# Patient Record
Sex: Female | Born: 1935 | Race: White | Hispanic: No | Smoking: Never smoker
Health system: Southern US, Community
[De-identification: ages and names within clinical notes are randomized; demographics above are authoritative.]

## PROBLEM LIST (undated history)

## (undated) DIAGNOSIS — I10 Essential (primary) hypertension: Secondary | ICD-10-CM

## (undated) DIAGNOSIS — S82899A Other fracture of unspecified lower leg, initial encounter for closed fracture: Secondary | ICD-10-CM

## (undated) DIAGNOSIS — E079 Disorder of thyroid, unspecified: Secondary | ICD-10-CM

## (undated) DIAGNOSIS — E785 Hyperlipidemia, unspecified: Secondary | ICD-10-CM

## (undated) HISTORY — PX: TOTAL HIP ARTHROPLASTY: SHX124

## (undated) HISTORY — PX: ABDOMINAL HYSTERECTOMY: SHX81

---

## 2009-05-17 ENCOUNTER — Ambulatory Visit (HOSPITAL_COMMUNITY): Admission: RE | Admit: 2009-05-17 | Discharge: 2009-05-18 | Payer: Self-pay | Admitting: Orthopaedic Surgery

## 2010-05-30 LAB — CBC
HCT: 36.5 % (ref 36.0–46.0)
Hemoglobin: 12.6 g/dL (ref 12.0–15.0)
MCHC: 34.4 g/dL (ref 30.0–36.0)
MCV: 93.3 fL (ref 78.0–100.0)
Platelets: 184 10*3/uL (ref 150–400)

## 2010-05-30 LAB — COMPREHENSIVE METABOLIC PANEL
ALT: 16 U/L (ref 0–35)
AST: 21 U/L (ref 0–37)
Albumin: 3.7 g/dL (ref 3.5–5.2)
Calcium: 8.8 mg/dL (ref 8.4–10.5)
Creatinine, Ser: 0.78 mg/dL (ref 0.4–1.2)
Total Bilirubin: 0.5 mg/dL (ref 0.3–1.2)
Total Protein: 7.3 g/dL (ref 6.0–8.3)

## 2010-05-30 LAB — PROTIME-INR
INR: 0.95 (ref 0.00–1.49)
Prothrombin Time: 12.6 seconds (ref 11.6–15.2)

## 2010-05-30 LAB — URINALYSIS, ROUTINE W REFLEX MICROSCOPIC
Glucose, UA: NEGATIVE mg/dL
Hgb urine dipstick: NEGATIVE
Ketones, ur: NEGATIVE mg/dL
pH: 6 (ref 5.0–8.0)

## 2010-05-30 LAB — DIFFERENTIAL
Neutro Abs: 2.7 10*3/uL (ref 1.7–7.7)
Neutrophils Relative %: 60 % (ref 43–77)

## 2010-05-30 LAB — URINE MICROSCOPIC-ADD ON

## 2010-08-11 ENCOUNTER — Encounter (HOSPITAL_COMMUNITY)
Admission: RE | Admit: 2010-08-11 | Discharge: 2010-08-11 | Disposition: A | Discharge status: Home / Self Care | Payer: Medicare Other | Source: Ambulatory Visit | Attending: Orthopaedic Surgery | Admitting: Orthopaedic Surgery

## 2010-08-11 ENCOUNTER — Other Ambulatory Visit (HOSPITAL_COMMUNITY): Payer: Self-pay | Admitting: Orthopaedic Surgery

## 2010-08-11 ENCOUNTER — Ambulatory Visit (HOSPITAL_COMMUNITY)
Admission: RE | Admit: 2010-08-11 | Discharge: 2010-08-11 | Disposition: A | Discharge status: Home / Self Care | Payer: Medicare Other | Source: Ambulatory Visit | Attending: Orthopaedic Surgery | Admitting: Orthopaedic Surgery

## 2010-08-11 DIAGNOSIS — M25559 Pain in unspecified hip: Secondary | ICD-10-CM | POA: Insufficient documentation

## 2010-08-11 DIAGNOSIS — Z01812 Encounter for preprocedural laboratory examination: Secondary | ICD-10-CM | POA: Insufficient documentation

## 2010-08-11 DIAGNOSIS — M25551 Pain in right hip: Secondary | ICD-10-CM

## 2010-08-11 DIAGNOSIS — Z01810 Encounter for preprocedural cardiovascular examination: Secondary | ICD-10-CM | POA: Insufficient documentation

## 2010-08-11 DIAGNOSIS — I10 Essential (primary) hypertension: Secondary | ICD-10-CM | POA: Insufficient documentation

## 2010-08-11 DIAGNOSIS — J438 Other emphysema: Secondary | ICD-10-CM | POA: Insufficient documentation

## 2010-08-11 DIAGNOSIS — Z01818 Encounter for other preprocedural examination: Secondary | ICD-10-CM | POA: Insufficient documentation

## 2010-08-11 LAB — CBC
Hemoglobin: 13.8 g/dL (ref 12.0–15.0)
MCV: 91.2 fL (ref 78.0–100.0)
Platelets: 238 10*3/uL (ref 150–400)
RDW: 14 % (ref 11.5–15.5)

## 2010-08-11 LAB — ABO/RH: ABO/RH(D): A POS

## 2010-08-11 LAB — TYPE AND SCREEN: ABO/RH(D): A POS

## 2010-08-11 LAB — SURGICAL PCR SCREEN: Staphylococcus aureus: NEGATIVE

## 2010-08-11 LAB — BASIC METABOLIC PANEL
CO2: 30 mEq/L (ref 19–32)
Calcium: 10 mg/dL (ref 8.4–10.5)
Chloride: 101 mEq/L (ref 96–112)

## 2010-08-15 ENCOUNTER — Inpatient Hospital Stay (HOSPITAL_COMMUNITY)
Admission: RE | Admit: 2010-08-15 | Discharge: 2010-08-18 | DRG: 470 | Disposition: A | Discharge status: Skilled Nursing Facility | Payer: Medicare Other | Source: Ambulatory Visit | Attending: Orthopaedic Surgery | Admitting: Orthopaedic Surgery

## 2010-08-15 ENCOUNTER — Inpatient Hospital Stay (HOSPITAL_COMMUNITY): Payer: Medicare Other

## 2010-08-15 DIAGNOSIS — Z7901 Long term (current) use of anticoagulants: Secondary | ICD-10-CM

## 2010-08-15 DIAGNOSIS — M87059 Idiopathic aseptic necrosis of unspecified femur: Principal | ICD-10-CM | POA: Diagnosis present

## 2010-08-15 DIAGNOSIS — S40029A Contusion of unspecified upper arm, initial encounter: Secondary | ICD-10-CM | POA: Diagnosis not present

## 2010-08-15 DIAGNOSIS — E785 Hyperlipidemia, unspecified: Secondary | ICD-10-CM | POA: Diagnosis present

## 2010-08-15 DIAGNOSIS — I10 Essential (primary) hypertension: Secondary | ICD-10-CM | POA: Diagnosis present

## 2010-08-15 DIAGNOSIS — G43909 Migraine, unspecified, not intractable, without status migrainosus: Secondary | ICD-10-CM | POA: Diagnosis present

## 2010-08-15 DIAGNOSIS — Y9210 Unspecified residential institution as the place of occurrence of the external cause: Secondary | ICD-10-CM | POA: Diagnosis not present

## 2010-08-15 DIAGNOSIS — W010XXA Fall on same level from slipping, tripping and stumbling without subsequent striking against object, initial encounter: Secondary | ICD-10-CM | POA: Diagnosis not present

## 2010-08-15 DIAGNOSIS — D62 Acute posthemorrhagic anemia: Secondary | ICD-10-CM | POA: Diagnosis not present

## 2010-08-15 LAB — URINALYSIS, ROUTINE W REFLEX MICROSCOPIC
Bilirubin Urine: NEGATIVE
Glucose, UA: NEGATIVE mg/dL
Hgb urine dipstick: NEGATIVE
pH: 6 (ref 5.0–8.0)

## 2010-08-15 LAB — HEPATIC FUNCTION PANEL
ALT: 11 U/L (ref 0–35)
Bilirubin, Direct: 0.1 mg/dL (ref 0.0–0.3)
Total Bilirubin: 0.5 mg/dL (ref 0.3–1.2)
Total Protein: 6.8 g/dL (ref 6.0–8.3)

## 2010-08-15 LAB — PROTIME-INR
INR: 0.93 (ref 0.00–1.49)
Prothrombin Time: 12.7 seconds (ref 11.6–15.2)

## 2010-08-15 LAB — URINE MICROSCOPIC-ADD ON

## 2010-08-16 LAB — BASIC METABOLIC PANEL WITH GFR
BUN: 19 mg/dL (ref 6–23)
CO2: 28 meq/L (ref 19–32)
Calcium: 8.6 mg/dL (ref 8.4–10.5)
Chloride: 103 meq/L (ref 96–112)
Creatinine, Ser: 0.89 mg/dL (ref 0.4–1.2)
GFR calc Af Amer: >60 mL/min
GFR calc non Af Amer: >60 mL/min
Glucose, Bld: 134 mg/dL — ABNORMAL HIGH (ref 70–99)
Potassium: 4.5 meq/L (ref 3.5–5.1)
Sodium: 139 meq/L (ref 135–145)

## 2010-08-16 LAB — CBC: MCV: 91.5 fL (ref 78.0–100.0)

## 2010-08-16 LAB — PROTIME-INR
INR: 1.05 (ref 0.00–1.49)
Prothrombin Time: 13.9 s (ref 11.6–15.2)

## 2010-08-17 LAB — CBC
HCT: 23.1 % — ABNORMAL LOW (ref 36.0–46.0)
Hemoglobin: 8.2 g/dL — ABNORMAL LOW (ref 12.0–15.0)
RBC: 2.58 MIL/uL — ABNORMAL LOW (ref 3.87–5.11)
WBC: 6 10*3/uL (ref 4.0–10.5)

## 2010-08-17 LAB — BASIC METABOLIC PANEL
BUN: 16 mg/dL (ref 6–23)
Chloride: 107 mEq/L (ref 96–112)
Glucose, Bld: 118 mg/dL — ABNORMAL HIGH (ref 70–99)
Potassium: 4 mEq/L (ref 3.5–5.1)
Sodium: 138 mEq/L (ref 135–145)

## 2010-08-17 LAB — PROTIME-INR: INR: 1.46 (ref 0.00–1.49)

## 2010-08-18 LAB — PROTIME-INR: INR: 1.91 — ABNORMAL HIGH (ref 0.00–1.49)

## 2010-09-12 NOTE — Discharge Summary (Signed)
  NAMECHRISTENA, [name]                 ACCOUNT NO.:  [id number]  MEDICAL RECORD NO.:  [id number]  LOCATION:  5001                         FACILITY:  [facility]  PHYSICIAN:  Mark C. Ophelia Charter, M.D.    DATE OF BIRTH:  May 01, 1928  DATE OF ADMISSION:  08/15/2010 DATE OF DISCHARGE:  08/18/2010                        DISCHARGE SUMMARY - REFERRING   She is being transferred to [name] at [name]  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  After informed consent, she was admitted and right total hip arthroplasty was performed on August 15, 2010, with 52 Press-Fit DePuy 10-degree liner and cemented DePuy #3 stem.  Postoperatively, she took her prophylactic IV antibiotics x1 2 doses.  She received a dose preop and was on Coumadin postop.  DVT prophylaxis which was monitored by pharmacy.  INR was 1.46 on August 17, 2010 and was 1.91 on August 18, 2010 the day of her transfer.  Hemoglobin was 8.2 and stable.  Electrolytes were normal other than glucose of 118. She did not require any transfusions.  She did well postoperatively. Had 1 episode when her bag came unplugged.  She tried to get up to the bathroom, but hit the call button and slipped on some urine, fell, bruising inside of her left arm, had good elbow and shoulder range of motion, and no injury to her hip.  She made good progress with physical therapy.  Was seen by PT and OT.  Incision was dry.  Postop x-rays showed excellent position and alignment.  Follow up with Dr. Ophelia Charter has already been scheduled in 10-12 days from now in his office for followup.  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  Postoperative anemia secondary to  normal intraoperative blood loss.  The patient is on Coumadin and will be on Coumadin for 3-1/2 weeks.  When she is 4 weeks from the date of surgery, Coumadin can be stopped.  Prescriptions were for Robaxin and also Percocet for pain. Robaxin for spasm.  HOME MEDICATIONS:  One aspirin a day was stopped while she is on Coumadin.  When she comes off Coumadin, she can restart though 1 aspirin a day.  CONDITION ON TRANSFER:  Improved.  She is on room 5001.    Mark C. Ophelia Charter, M.D.    MCY/MEDQ  D:  08/18/2010  T:  08/18/2010  Job:  [id number]  cc:   [name]  Electronically Signed by Annell Greening M.D. on 09/12/2010 02:24:55 PM

## 2010-09-12 NOTE — Op Note (Signed)
[name], [name]                 ACCOUNT NO.:  [id number]  MEDICAL RECORD NO.:  [id number]  LOCATION:  5001                         FACILITY:  [facility]  PHYSICIAN:  Manson Luckadoo C. Ophelia Charter, M.D.    DATE OF BIRTH:  10-04-1928  DATE OF PROCEDURE:  08/15/2010 DATE OF DISCHARGE:                              OPERATIVE REPORT   PREOPERATIVE DIAGNOSES:  Right hip avascular necrosis, previous femoral neck fracture pinning.  POSTOPERATIVE DIAGNOSES:  Right hip avascular necrosis, previous femoral neck fracture pinning.  PROCEDURE:  Right total hip arthroplasty.  SURGEON:  Annell Greening, MD  ASSISTANT:  Wende Neighbors, PA-C  ANESTHESIA:  GOT plus Marcaine local.  PROCEDURE IN DETAIL:  After induction of general anesthesia, orotracheal intubation, the patient was placed in lateral position, Audel Coakley VII frame, standard prepping and draping.  DuraPrep was used.  Preoperative antibiotics were given after draping, sterile skin marker for posterior approach, Betadine, sterile drape x2 sealing the skin.  Time-out procedure was completed.  Posterior approach was made.  Gluteus maximus was split in line with the fibers.  There was some periosteal bone formation with some deformity of the femur posteriorly in the intertrochanteric region and low subcapital region from the previous fracture, but the femoral neck was completely healed.  Posterior capsule was opened.  Piriformis was cut for later closure.  Hip was dislocated. Neck cut one fingerbreadth above the lesser trochanter.  As stab wound was addressed first, sequential reaming up to a 51 for 52 Press-Fit cup. The cup was inserted after touch up reaming for it would fit appropriately, and the 51 reamer was not really sharp, so we backed up to the 50 reamer.  This allowed some slight medialization to get down the base of the fovea and then removed the 51 and then the 52 cup gave excellent fit, was unable to be dislodged, was put at 45 degrees  of abduction and 30 degrees of cup flexion.  The blocker was placed and the filling of the central hole with the screw until it was tight.  10-degree liner was inserted with the buildup down posterolateral.  Next, the femur was repaired with canal starter reamer, lateral wiser reaming then broaching up to a #3.  Distal cement restrictor was placed, some was vacuum mixed.  A 11.5 centralizer was placed on the tip of the stem of the femoral DePuy prosthesis once it was opened.  Trial that showed a +1.5 neck gave excellent restoration of leg length and the hip could be flexed to 90 degrees, internal rotation 80-85 degrees for the hip would began to sublux.  Cement was vacuum mixed, inserted with glue gun, pressurized, and prosthesis held until the cement was hard.  Excess cement was removed.  Trunnion was cleaned and hip reduced.  Identical findings of excellent stability. Piriformis was repaired followed by gluteus maximus with #1-0 Ethibond and 0 Vicryl placed in gluteus maximus fascia, 2-0 Vicryl in subcutaneous tissue, 3-0 Vicryl subcuticular skin closure.  Tincture of Benzoin, Steri-Strips, postop dressing, and a knee immobilizer. Instrument count was correct, and time-out procedure was completed.     Darrah Dredge C. Ophelia Charter, M.D.     MCY/MEDQ  D:  08/15/2010  T:  08/16/2010  Job:  [id number]  Electronically Signed by Annell Greening M.D. on 09/12/2010 02:25:00 PM

## 2010-11-21 ENCOUNTER — Ambulatory Visit (HOSPITAL_COMMUNITY)
Admission: RE | Admit: 2010-11-21 | Discharge: 2010-11-21 | Disposition: A | Discharge status: Home / Self Care | Payer: Medicare Other | Source: Ambulatory Visit | Attending: Orthopaedic Surgery | Admitting: Orthopaedic Surgery

## 2010-11-21 DIAGNOSIS — M79609 Pain in unspecified limb: Secondary | ICD-10-CM | POA: Insufficient documentation

## 2010-11-21 DIAGNOSIS — I82819 Embolism and thrombosis of superficial veins of unspecified lower extremities: Secondary | ICD-10-CM | POA: Insufficient documentation

## 2010-11-21 DIAGNOSIS — M7989 Other specified soft tissue disorders: Secondary | ICD-10-CM | POA: Insufficient documentation

## 2012-07-14 ENCOUNTER — Emergency Department (HOSPITAL_BASED_OUTPATIENT_CLINIC_OR_DEPARTMENT_OTHER)
Admission: EM | Admit: 2012-07-14 | Discharge: 2012-07-14 | Disposition: A | Discharge status: Home / Self Care | Payer: Medicare Other | Attending: Emergency Medicine | Admitting: Emergency Medicine

## 2012-07-14 ENCOUNTER — Emergency Department (HOSPITAL_BASED_OUTPATIENT_CLINIC_OR_DEPARTMENT_OTHER): Payer: Medicare Other

## 2012-07-14 ENCOUNTER — Encounter (HOSPITAL_BASED_OUTPATIENT_CLINIC_OR_DEPARTMENT_OTHER): Payer: Self-pay

## 2012-07-14 DIAGNOSIS — W100XXA Fall (on)(from) escalator, initial encounter: Secondary | ICD-10-CM | POA: Insufficient documentation

## 2012-07-14 DIAGNOSIS — IMO0002 Reserved for concepts with insufficient information to code with codable children: Secondary | ICD-10-CM | POA: Insufficient documentation

## 2012-07-14 DIAGNOSIS — I10 Essential (primary) hypertension: Secondary | ICD-10-CM | POA: Insufficient documentation

## 2012-07-14 DIAGNOSIS — Y929 Unspecified place or not applicable: Secondary | ICD-10-CM | POA: Insufficient documentation

## 2012-07-14 DIAGNOSIS — E079 Disorder of thyroid, unspecified: Secondary | ICD-10-CM | POA: Insufficient documentation

## 2012-07-14 DIAGNOSIS — Z880 Allergy status to penicillin: Secondary | ICD-10-CM | POA: Insufficient documentation

## 2012-07-14 DIAGNOSIS — Y9389 Activity, other specified: Secondary | ICD-10-CM | POA: Insufficient documentation

## 2012-07-14 DIAGNOSIS — E785 Hyperlipidemia, unspecified: Secondary | ICD-10-CM | POA: Insufficient documentation

## 2012-07-14 DIAGNOSIS — Z7982 Long term (current) use of aspirin: Secondary | ICD-10-CM | POA: Insufficient documentation

## 2012-07-14 DIAGNOSIS — S46912A Strain of unspecified muscle, fascia and tendon at shoulder and upper arm level, left arm, initial encounter: Secondary | ICD-10-CM

## 2012-07-14 DIAGNOSIS — Z79899 Other long term (current) drug therapy: Secondary | ICD-10-CM | POA: Insufficient documentation

## 2012-07-14 HISTORY — DX: Essential (primary) hypertension: I10

## 2012-07-14 HISTORY — DX: Disorder of thyroid, unspecified: E07.9

## 2012-07-14 HISTORY — DX: Hyperlipidemia, unspecified: E78.5

## 2012-07-14 NOTE — ED Notes (Signed)
Pt states that she injured her shoulder in a fall on an escalator on Thursday.  Pt states that she has severe pain in the L shoulder, ROM limited due to pain.  PMS intact.  Also c/o L knee pain.

## 2012-07-14 NOTE — ED Provider Notes (Signed)
Medical screening examination/treatment/procedure(s) were performed by non-physician practitioner and as supervising physician I was immediately available for consultation/collaboration.  Ethelda Chick, MD 07/14/12 [phone]

## 2012-07-14 NOTE — ED Provider Notes (Signed)
History     CSN: [id number]  Arrival date & time 07/14/12  1003   First MD Initiated Contact with Patient 07/14/12 1134      Chief Complaint  Patient presents with  . Shoulder Injury    (Consider location/radiation/quality/duration/timing/severity/associated sxs/prior treatment) Patient is a 79 y.o. female presenting with shoulder injury. The history is provided by the patient. No language interpreter was used.  Shoulder Injury This is a new problem. The current episode started in the past 7 days. The problem occurs constantly. The problem has been unchanged. Associated symptoms include joint swelling. Nothing aggravates the symptoms. She has tried nothing for the symptoms. The treatment provided moderate relief.   Pt fell on an escalator 4 days ago. Past Medical History  Diagnosis Date  . Hypertension   . Hyperlipidemia   . Thyroid disease     Past Surgical History  Procedure Laterality Date  . Total hip arthroplasty Right   . Abdominal hysterectomy      History reviewed. No pertinent family history.  History  Substance Use Topics  . Smoking status: Never Smoker   . Smokeless tobacco: Never Used  . Alcohol Use: No    OB History   Grav Para Term Preterm Abortions TAB SAB Ect Mult Living                  Review of Systems  Musculoskeletal: Positive for joint swelling.  All other systems reviewed and are negative.    Allergies  Codeine; Levaquin; Penicillins; and Sulfa antibiotics  Home Medications   Current Outpatient Rx  Name  Route  Sig  Dispense  Refill  . aspirin 81 MG tablet   Oral   Take 81 mg by mouth daily.         . Cholecalciferol (VITAMIN D-3) 1000 UNITS CAPS   Oral   Take 1 capsule by mouth daily.         . enalapril (VASOTEC) 10 MG tablet   Oral   Take 10 mg by mouth daily.         Marland Kitchen levothyroxine (SYNTHROID, LEVOTHROID) 75 MCG tablet   Oral   Take 75 mcg by mouth daily before breakfast.         . lovastatin (MEVACOR) 20  MG tablet   Oral   Take 20 mg by mouth at bedtime.           BP 155/92  Pulse 79  Temp(Src) 98.7 F (37.1 C) (Oral)  Resp 20  Ht 5\' 6"  (1.676 m)  Wt 145 lb (65.772 kg)  BMI 23.41 kg/m2  SpO2 100%  Physical Exam  Nursing note and vitals reviewed. Constitutional: She appears well-developed and well-nourished.  HENT:  Head: Normocephalic and atraumatic.  Eyes: Pupils are equal, round, and reactive to light.  Neck: Normal range of motion.  Cardiovascular: Normal rate.   Pulmonary/Chest: Effort normal.  Musculoskeletal: She exhibits tenderness.  Tender left shoulder,  Decrease abduction,   nv and ns intact  Neurological: She is alert.  Skin: Skin is warm.  Psychiatric: She has a normal mood and affect.    ED Course  Procedures (including critical care time)  Labs Reviewed - No data to display Dg Shoulder Left  07/14/2012  *RADIOLOGY REPORT*  Clinical Data: Shoulder injury.  Posterior pain.  LEFT SHOULDER - 2+ VIEW  Comparison: None.  Findings: The left shoulder is located.  No acute bone or soft tissue abnormalities are present.  The visualized hemithorax is clear.  Mild degenerative changes are noted at the glenohumeral and AC joints.  IMPRESSION:  1.  Mild degenerative change. 2.  No acute abnormality.   Original Report Authenticated By: Marin Roberts, M.D.      1. Shoulder strain, left, initial encounter       MDM  Pt advised to follow up with her Md for recheck in 1 week.  Gentle range of motion,          Elson Areas, [place] 07/14/12 1227

## 2012-08-04 IMAGING — CR DG CHEST 2V
2 series · 2 of 2 positions shown · non-contrast
Comparison: 05/17/2009

CLINICAL DATA: Preoperative assessment for right total hip
arthroplasty, history hypertension

CHEST - 2 VIEW

[view not recorded (1 of 2)]
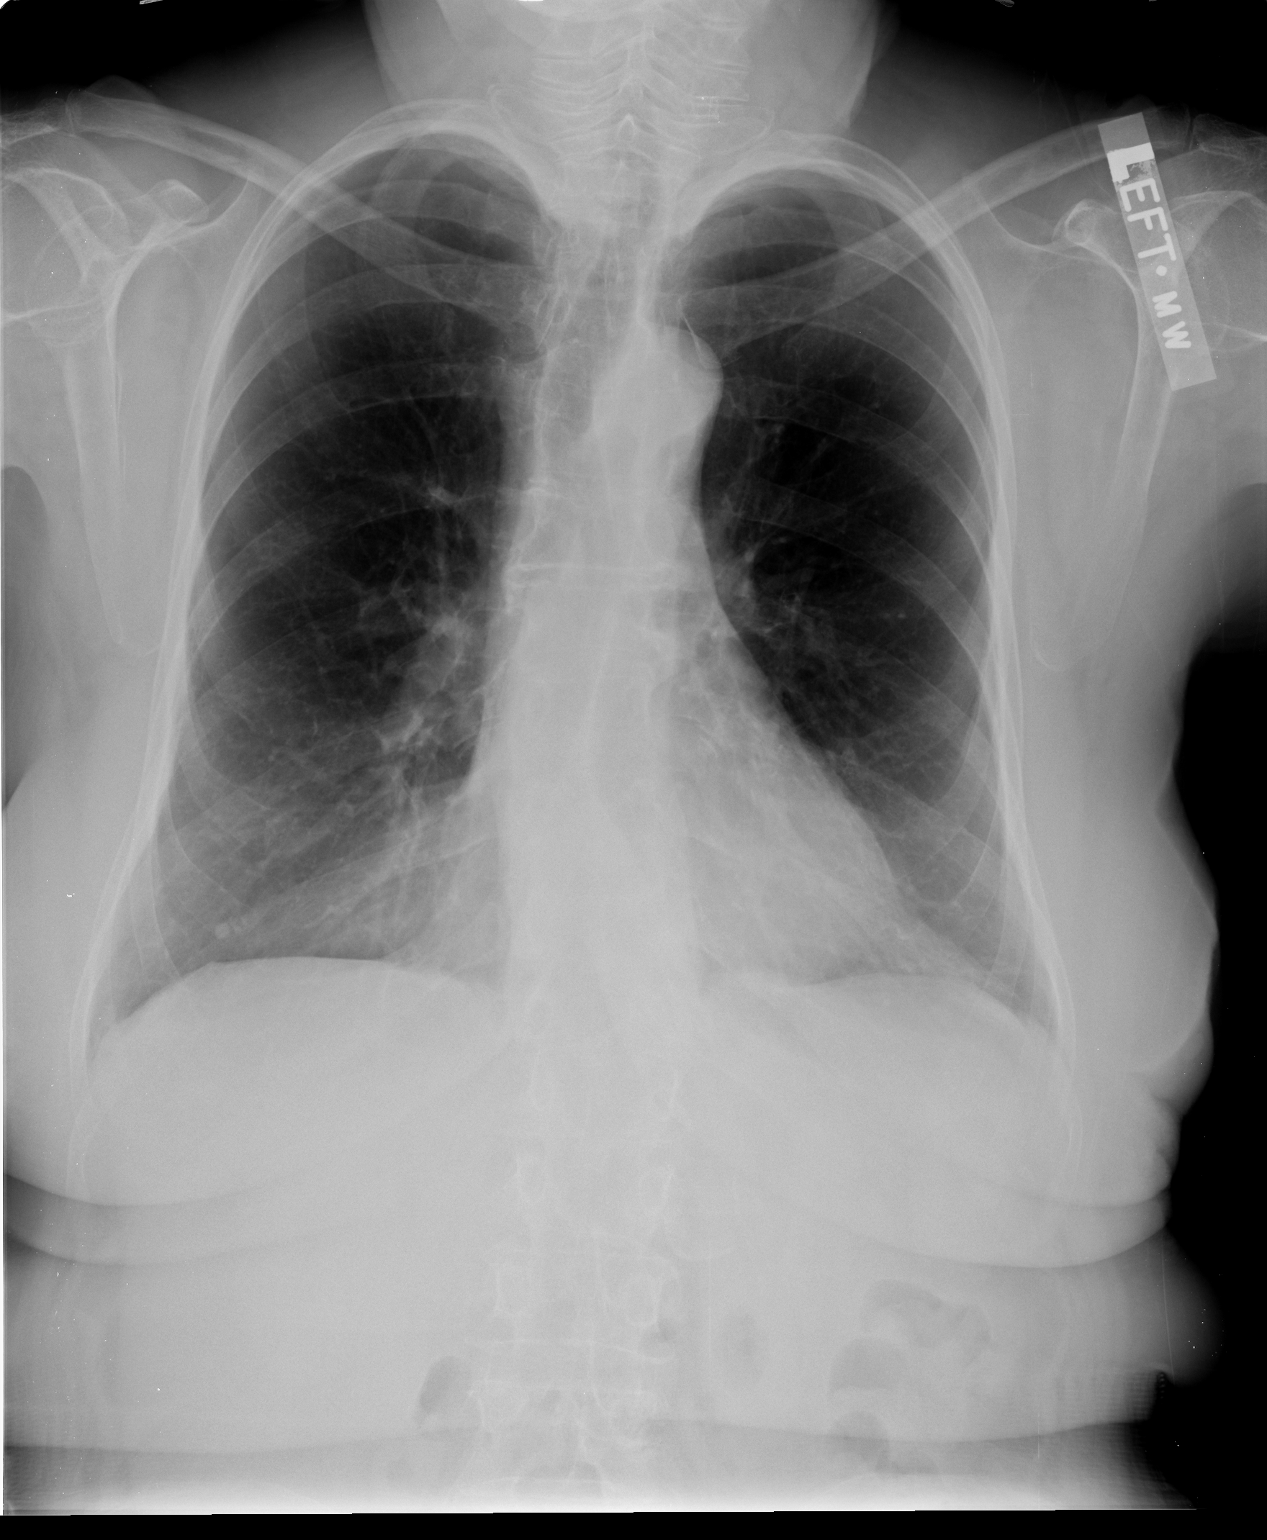

[view not recorded (2 of 2)]
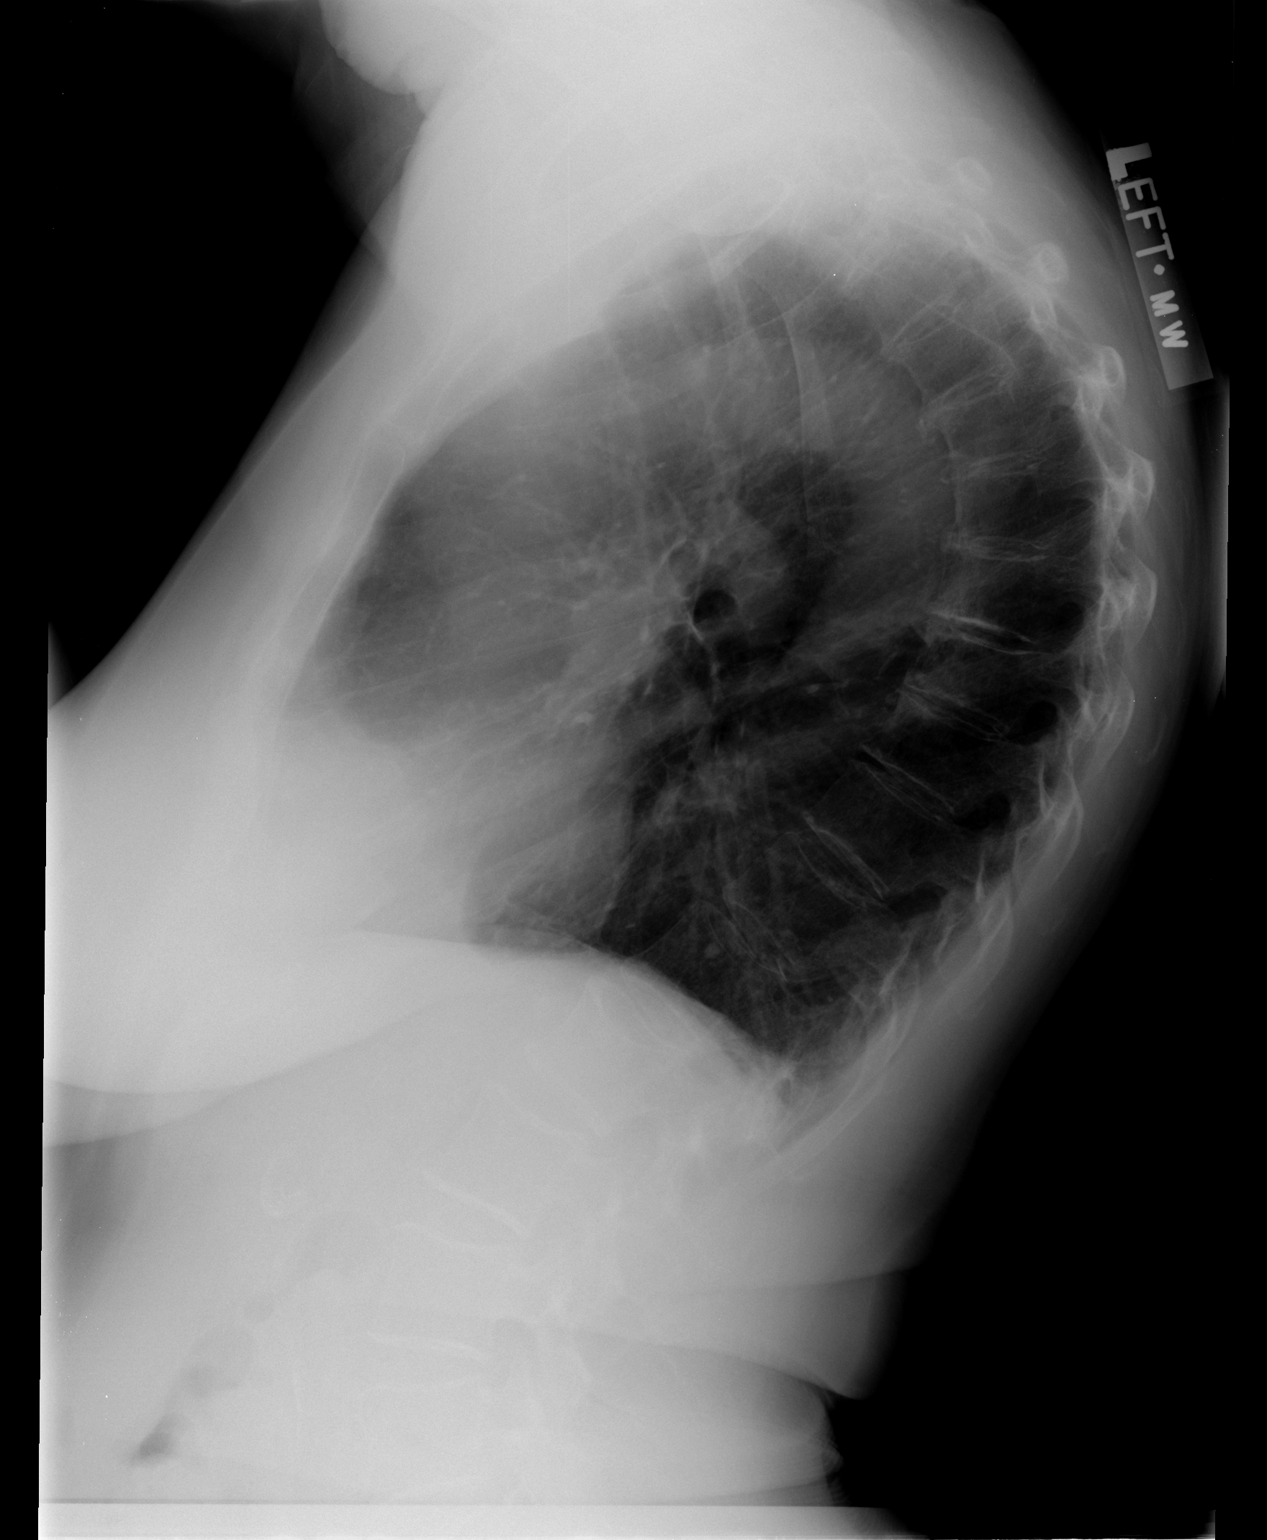

[2 of 2 positions shown; findings below may reference images not displayed]

FINDINGS: Normal heart size, mediastinal contours, and pulmonary vascularity.
Emphysematous changes without infiltrate or effusion.
Minimal subsegmental atelectasis versus scarring at lung bases.
Bones appear demineralized.
No pneumothorax.
IMPRESSION: Emphysematous changes with minimal bibasilar atelectasis versus
scarring.
No acute abnormalities otherwise seen.

## 2012-08-08 IMAGING — CR DG HIP 1V PORT*R*
1 series · 1 of 1 positions shown · non-contrast
Comparison: AP view of the pelvis 08/15/2010

CLINICAL DATA: Right total hip arthroplasty.

PORTABLE RIGHT HIP - 1 VIEW

[view not recorded]
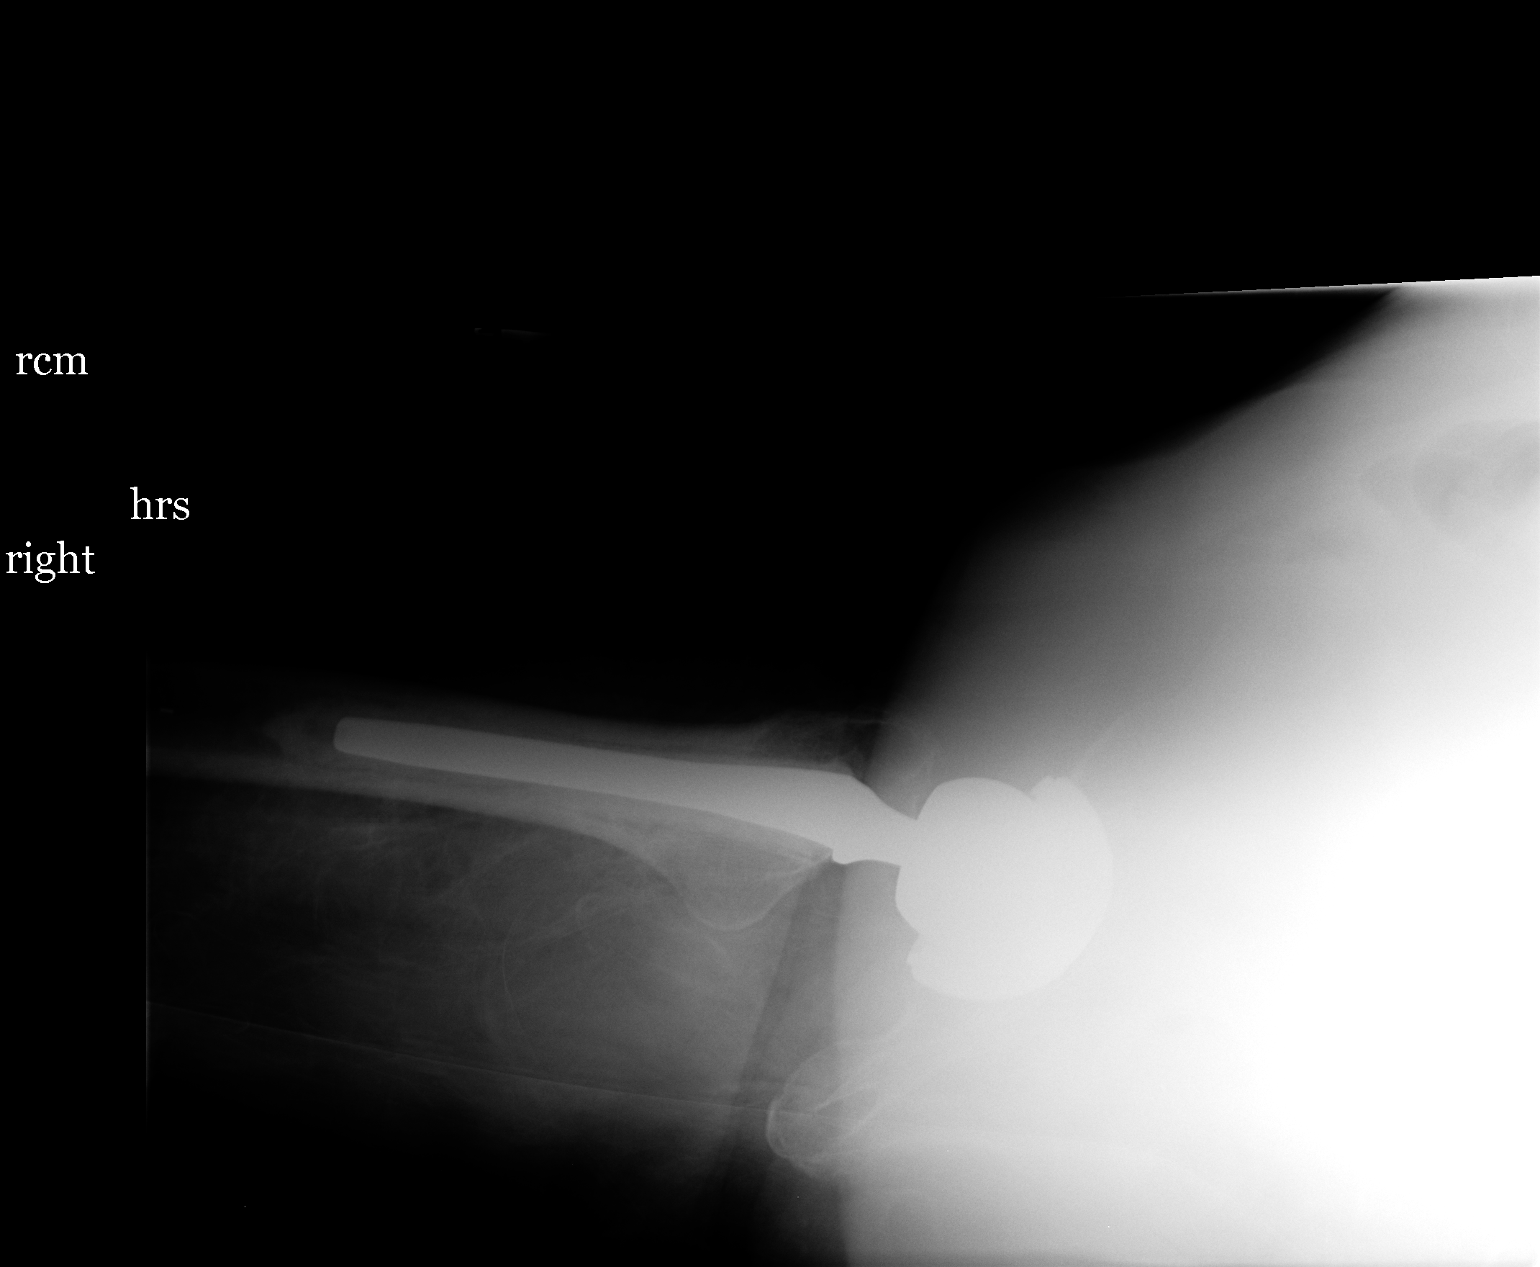

[1 of 1 positions shown; findings below may reference images not displayed]

FINDINGS: The lateral view demonstrates that the acetabular and
femoral components appear in good position in the lateral
projection.
IMPRESSION: Satisfactory appearance of the right hip after total hip
arthroplasty.

## 2012-08-10 DIAGNOSIS — I10 Essential (primary) hypertension: Secondary | ICD-10-CM | POA: Insufficient documentation

## 2012-08-10 DIAGNOSIS — M810 Age-related osteoporosis without current pathological fracture: Secondary | ICD-10-CM | POA: Insufficient documentation

## 2012-08-10 DIAGNOSIS — E7800 Pure hypercholesterolemia, unspecified: Secondary | ICD-10-CM | POA: Insufficient documentation

## 2012-08-10 DIAGNOSIS — L309 Dermatitis, unspecified: Secondary | ICD-10-CM | POA: Insufficient documentation

## 2012-12-14 ENCOUNTER — Emergency Department (HOSPITAL_BASED_OUTPATIENT_CLINIC_OR_DEPARTMENT_OTHER): Payer: Medicare Other

## 2012-12-14 ENCOUNTER — Emergency Department (HOSPITAL_BASED_OUTPATIENT_CLINIC_OR_DEPARTMENT_OTHER)
Admission: EM | Admit: 2012-12-14 | Discharge: 2012-12-14 | Disposition: A | Discharge status: Home / Self Care | Payer: Medicare Other | Attending: Emergency Medicine | Admitting: Emergency Medicine

## 2012-12-14 ENCOUNTER — Encounter (HOSPITAL_BASED_OUTPATIENT_CLINIC_OR_DEPARTMENT_OTHER): Payer: Self-pay | Admitting: Emergency Medicine

## 2012-12-14 DIAGNOSIS — Z880 Allergy status to penicillin: Secondary | ICD-10-CM | POA: Insufficient documentation

## 2012-12-14 DIAGNOSIS — E079 Disorder of thyroid, unspecified: Secondary | ICD-10-CM | POA: Insufficient documentation

## 2012-12-14 DIAGNOSIS — R296 Repeated falls: Secondary | ICD-10-CM | POA: Insufficient documentation

## 2012-12-14 DIAGNOSIS — Z7982 Long term (current) use of aspirin: Secondary | ICD-10-CM | POA: Insufficient documentation

## 2012-12-14 DIAGNOSIS — Y929 Unspecified place or not applicable: Secondary | ICD-10-CM | POA: Insufficient documentation

## 2012-12-14 DIAGNOSIS — E785 Hyperlipidemia, unspecified: Secondary | ICD-10-CM | POA: Insufficient documentation

## 2012-12-14 DIAGNOSIS — Z79899 Other long term (current) drug therapy: Secondary | ICD-10-CM | POA: Insufficient documentation

## 2012-12-14 DIAGNOSIS — S8011XA Contusion of right lower leg, initial encounter: Secondary | ICD-10-CM

## 2012-12-14 DIAGNOSIS — Y9389 Activity, other specified: Secondary | ICD-10-CM | POA: Insufficient documentation

## 2012-12-14 DIAGNOSIS — S8010XA Contusion of unspecified lower leg, initial encounter: Secondary | ICD-10-CM | POA: Insufficient documentation

## 2012-12-14 DIAGNOSIS — I10 Essential (primary) hypertension: Secondary | ICD-10-CM | POA: Insufficient documentation

## 2012-12-14 NOTE — ED Notes (Signed)
Patient here with right hip pain x 8 days after falling while unloading the car. Patient reports that she had hip replacement to thaqt hip as well. Pain with any ambulation

## 2012-12-14 NOTE — ED Provider Notes (Signed)
CSN: [id number]     Arrival date & time 12/14/12  1000 History   First MD Initiated Contact with Patient 12/14/12 1008     Chief Complaint  Patient presents with  . Hip Pain   (Consider location/radiation/quality/duration/timing/severity/associated sxs/prior Treatment) Patient is a 79 y.o. female presenting with hip pain.  Hip Pain   Pt with history of right hip replacement reports she fell about 8 days ago, landing on R side. She had bruise on R face which has since resolved however she has large bruise on R hip/thigh that has softened but continues to have some pain. She reports she had similar injury several years ago in [place] requiring drain to be put into a hematoma.   Past Medical History  Diagnosis Date  . Hypertension   . Hyperlipidemia   . Thyroid disease    Past Surgical History  Procedure Laterality Date  . Total hip arthroplasty Right   . Abdominal hysterectomy     No family history on file. History  Substance Use Topics  . Smoking status: Never Smoker   . Smokeless tobacco: Never Used  . Alcohol Use: No   OB History   Grav Para Term Preterm Abortions TAB SAB Ect Mult Living                 Review of Systems All other systems reviewed and are negative except as noted in HPI.   Allergies  Codeine; Levaquin; Penicillins; and Sulfa antibiotics  Home Medications   Current Outpatient Rx  Name  Route  Sig  Dispense  Refill  . aspirin 81 MG tablet   Oral   Take 81 mg by mouth daily.         . Cholecalciferol (VITAMIN D-3) 1000 UNITS CAPS   Oral   Take 1 capsule by mouth daily.         . enalapril (VASOTEC) 10 MG tablet   Oral   Take 10 mg by mouth daily.         Marland Kitchen levothyroxine (SYNTHROID, LEVOTHROID) 75 MCG tablet   Oral   Take 75 mcg by mouth daily before breakfast.         . lovastatin (MEVACOR) 20 MG tablet   Oral   Take 20 mg by mouth at bedtime.          BP 147/86  Pulse 78  Temp(Src) 98 F (36.7 C) (Oral)  Resp 18  SpO2  100% Physical Exam  Nursing note and vitals reviewed. Constitutional: She is oriented to person, place, and time. She appears well-developed and well-nourished.  HENT:  Head: Normocephalic and atraumatic.  Eyes: EOM are normal. Pupils are equal, round, and reactive to light.  Neck: Normal range of motion. Neck supple.  Cardiovascular: Normal rate, normal heart sounds and intact distal pulses.   Pulmonary/Chest: Effort normal and breath sounds normal.  Abdominal: Bowel sounds are normal. She exhibits no distension. There is no tenderness.  Musculoskeletal: Normal range of motion. She exhibits tenderness. She exhibits no edema.  Large area of ecchymosis on R lateral thigh tracking down to popliteal fossa, tender to palpation, no fluid collection  Neurological: She is alert and oriented to person, place, and time. She has normal strength. No cranial nerve deficit or sensory deficit.  Skin: Skin is warm and dry. No rash noted.  Psychiatric: She has a normal mood and affect.    ED Course  Procedures (including critical care time) Labs Review Labs Reviewed - No data to display  Imaging Review Dg Femur Right  12/14/2012   CLINICAL DATA:  79 year old female with right hip pain following fall. History of right hip arthroplasty.  EXAM: RIGHT FEMUR - 2 VIEW  COMPARISON:  08/15/2010  FINDINGS: Right total hip arthroplasty identified.  There is no evidence of fracture, subluxation or dislocation.  No complicating hardware features are present.  Mild degenerative changes in the knee are present.  Vascular calcifications are identified.  IMPRESSION: No evidence of acute abnormality.  Right total hip arthroplasty without complicating features.   Electronically Signed   By: Laveda Abbe M.D.   On: 12/14/2012 11:01    EKG Interpretation   None       MDM   1. Contusion of right leg, initial encounter     Xray neg for injury or hardware disruption. Pt with contusion but pain well controlled. Advised  conservative treatment for now with close PCP followup if needed.     Krikor Willet B. Bernette Mayers, MD 12/14/12 1137

## 2013-06-25 DIAGNOSIS — E039 Hypothyroidism, unspecified: Secondary | ICD-10-CM | POA: Insufficient documentation

## 2013-06-25 DIAGNOSIS — E559 Vitamin D deficiency, unspecified: Secondary | ICD-10-CM | POA: Insufficient documentation

## 2013-06-25 DIAGNOSIS — H409 Unspecified glaucoma: Secondary | ICD-10-CM | POA: Insufficient documentation

## 2013-10-08 DIAGNOSIS — I83813 Varicose veins of bilateral lower extremities with pain: Secondary | ICD-10-CM | POA: Insufficient documentation

## 2013-12-24 ENCOUNTER — Emergency Department (HOSPITAL_BASED_OUTPATIENT_CLINIC_OR_DEPARTMENT_OTHER)
Admission: EM | Admit: 2013-12-24 | Discharge: 2013-12-24 | Discharge status: Against Medical Advice | Payer: Medicare Other

## 2014-08-10 ENCOUNTER — Other Ambulatory Visit: Payer: Self-pay | Admitting: Orthopaedic Surgery

## 2014-08-10 DIAGNOSIS — M25512 Pain in left shoulder: Secondary | ICD-10-CM

## 2014-08-24 ENCOUNTER — Ambulatory Visit
Admission: RE | Admit: 2014-08-24 | Discharge: 2014-08-24 | Disposition: A | Discharge status: Home / Self Care | Payer: Medicare Other | Source: Ambulatory Visit | Attending: Orthopaedic Surgery | Admitting: Orthopaedic Surgery

## 2014-08-24 DIAGNOSIS — M25512 Pain in left shoulder: Secondary | ICD-10-CM

## 2014-10-07 DIAGNOSIS — H5703 Miosis: Secondary | ICD-10-CM | POA: Insufficient documentation

## 2014-10-07 DIAGNOSIS — H269 Unspecified cataract: Secondary | ICD-10-CM | POA: Insufficient documentation

## 2015-10-17 ENCOUNTER — Emergency Department (HOSPITAL_BASED_OUTPATIENT_CLINIC_OR_DEPARTMENT_OTHER)
Admission: EM | Admit: 2015-10-17 | Discharge: 2015-10-17 | Disposition: A | Discharge status: Home / Self Care | Payer: Medicare Other | Attending: Emergency Medicine | Admitting: Emergency Medicine

## 2015-10-17 ENCOUNTER — Emergency Department (HOSPITAL_BASED_OUTPATIENT_CLINIC_OR_DEPARTMENT_OTHER): Payer: Medicare Other

## 2015-10-17 ENCOUNTER — Encounter (HOSPITAL_BASED_OUTPATIENT_CLINIC_OR_DEPARTMENT_OTHER): Payer: Self-pay | Admitting: *Deleted

## 2015-10-17 DIAGNOSIS — S82401A Unspecified fracture of shaft of right fibula, initial encounter for closed fracture: Secondary | ICD-10-CM

## 2015-10-17 DIAGNOSIS — Y929 Unspecified place or not applicable: Secondary | ICD-10-CM | POA: Insufficient documentation

## 2015-10-17 DIAGNOSIS — W07XXXA Fall from chair, initial encounter: Secondary | ICD-10-CM | POA: Insufficient documentation

## 2015-10-17 DIAGNOSIS — S82831A Other fracture of upper and lower end of right fibula, initial encounter for closed fracture: Secondary | ICD-10-CM | POA: Diagnosis not present

## 2015-10-17 DIAGNOSIS — Z7982 Long term (current) use of aspirin: Secondary | ICD-10-CM | POA: Insufficient documentation

## 2015-10-17 DIAGNOSIS — Y999 Unspecified external cause status: Secondary | ICD-10-CM | POA: Insufficient documentation

## 2015-10-17 DIAGNOSIS — S99911A Unspecified injury of right ankle, initial encounter: Secondary | ICD-10-CM | POA: Diagnosis present

## 2015-10-17 DIAGNOSIS — Z79899 Other long term (current) drug therapy: Secondary | ICD-10-CM | POA: Diagnosis not present

## 2015-10-17 DIAGNOSIS — I10 Essential (primary) hypertension: Secondary | ICD-10-CM | POA: Insufficient documentation

## 2015-10-17 DIAGNOSIS — Y939 Activity, unspecified: Secondary | ICD-10-CM | POA: Insufficient documentation

## 2015-10-17 MED ORDER — HYDROCODONE-ACETAMINOPHEN 5-325 MG PO TABS: 1.0000 | ORAL_TABLET | ORAL | 0 refills | Status: DC | PRN

## 2015-10-17 NOTE — ED Provider Notes (Signed)
[facility] Note   CSN: [id number] Arrival date & time: 10/17/15  1204  First Provider Contact:  None       History   Chief Complaint Chief Complaint  Patient presents with  . Ankle Pain    right    HPI [name] is a 82 y.o. female.  Pt stood up 2 days ago and her right ankle turned.  Since then, she has had a lot of pain and bruising to the right ankle.  She is able to walk on it, but it hurts to walk.  She has osteoporosis, so she wanted to make sure that her ankle was not broken.      Past Medical History:  Diagnosis Date  . Hyperlipidemia   . Hypertension   . Thyroid disease     There are no active problems to display for this patient.   Past Surgical History:  Procedure Laterality Date  . ABDOMINAL HYSTERECTOMY    . TOTAL HIP ARTHROPLASTY Right     OB History    No data available       Home Medications    Prior to Admission medications   Medication Sig Start Date End Date Taking? Authorizing Provider  aspirin 81 MG tablet Take 81 mg by mouth daily.   Yes Historical Provider, MD  Cholecalciferol (VITAMIN D-3) 1000 UNITS CAPS Take 1 capsule by mouth daily.   Yes Historical Provider, MD  enalapril (VASOTEC) 10 MG tablet Take 20 mg by mouth daily.    Yes Historical Provider, MD  levothyroxine (SYNTHROID, LEVOTHROID) 75 MCG tablet Take 0.88 mcg by mouth daily before breakfast.    Yes Historical Provider, MD  lovastatin (MEVACOR) 20 MG tablet Take 20 mg by mouth at bedtime.   Yes Historical Provider, MD  PRESCRIPTION MEDICATION Littanoprost eye drops .005   Yes Historical Provider, MD  HYDROcodone-acetaminophen (NORCO/VICODIN) 5-325 MG tablet Take 1 tablet by mouth every 4 (four) hours as needed. 10/17/15   Jacalyn LefevreJulie Rosary Filosa, MD    Family History No family history on file.  Social History Social History  Substance Use Topics  . Smoking status: Never Smoker  . Smokeless tobacco: Never Used  . Alcohol use No     Allergies     Codeine; Honey bee treatment [bee venom]; Levaquin [levofloxacin in d5w]; Penicillins; and Sulfa antibiotics   Review of Systems Review of Systems  Musculoskeletal:       Right ankle pain  All other systems reviewed and are negative.    Physical Exam Updated Vital Signs BP 155/88 (BP Location: Right Arm)   Pulse 72   Temp 98.3 F (36.8 C) (Oral)   Resp 18   Ht 5\' 5"  (1.651 m)   Wt 149 lb (67.6 kg)   SpO2 100%   BMI 24.79 kg/m   Physical Exam  Constitutional: She is oriented to person, place, and time. She appears well-developed and well-nourished.  HENT:  Head: Normocephalic and atraumatic.  Right Ear: External ear normal.  Left Ear: External ear normal.  Nose: Nose normal.  Mouth/Throat: Oropharynx is clear and moist.  Eyes: Conjunctivae and EOM are normal. Pupils are equal, round, and reactive to light.  Neck: Normal range of motion. Neck supple.  Cardiovascular: Normal rate, regular rhythm, normal heart sounds and intact distal pulses.   Pulmonary/Chest: Effort normal and breath sounds normal.  Abdominal: Soft. Bowel sounds are normal.  Musculoskeletal:       Right ankle: She exhibits swelling and ecchymosis. Tenderness.  Neurological: She is alert and oriented to person, place, and time.  Skin: Skin is warm and dry.  Psychiatric: She has a normal mood and affect. Her behavior is normal. Judgment and thought content normal.  Nursing note and vitals reviewed.    ED Treatments / Results  Labs (all labs ordered are listed, but only abnormal results are displayed) Labs Reviewed - No data to display  EKG  EKG Interpretation None       Radiology Dg Ankle Complete Right  Result Date: 10/17/2015 CLINICAL DATA:  Fall 2 days ago. Right ankle pain and swelling. Initial encounter. EXAM: RIGHT ANKLE - COMPLETE 3+ VIEW COMPARISON:  None. FINDINGS: Nondisplaced fracture is seen involving the distal fibula just above the level of the tibial plafond. No other  fractures identified. No evidence of dislocation. Peripheral vascular calcification noted. IMPRESSION: Nondisplaced fracture of distal fibula. Electronically Signed   By: Myles Rosenthal M.D.   On: 10/17/2015 13:44    Procedures Procedures (including critical care time)  Medications Ordered in ED Medications - No data to display   Initial Impression / Assessment and Plan / ED Course  I have reviewed the triage vital signs and the nursing notes.  Pertinent labs & imaging results that were available during my care of the patient were reviewed by me and considered in my medical decision making (see chart for details).  Clinical Course   Pt uses a walker to walk, so she will be put in a cam walker.  She is given the number to ortho and is instr to return if worse.   Final Clinical Impressions(s) / ED Diagnoses   Final diagnoses:  Fibula fracture, right, closed, initial encounter    New Prescriptions New Prescriptions   HYDROCODONE-ACETAMINOPHEN (NORCO/VICODIN) 5-325 MG TABLET    Take 1 tablet by mouth every 4 (four) hours as needed.     Jacalyn Lefevre, MD 10/17/15 [phone]

## 2015-10-17 NOTE — ED Notes (Signed)
Pt refused wheelchair. Insists on walking with her walker.

## 2015-10-17 NOTE — ED Triage Notes (Signed)
Patient states she was sitting in a chair and when she went to stand up, her right ankle gave out and went under her causing her to fall.

## 2015-10-20 ENCOUNTER — Ambulatory Visit: Payer: Medicare Other | Admitting: Family Medicine

## 2015-10-21 ENCOUNTER — Encounter: Payer: Self-pay | Admitting: Family Medicine

## 2015-10-21 ENCOUNTER — Telehealth: Payer: Self-pay | Admitting: Family Medicine

## 2015-10-21 ENCOUNTER — Ambulatory Visit: Payer: Medicare Other | Admitting: Family Medicine

## 2015-10-21 ENCOUNTER — Ambulatory Visit (INDEPENDENT_AMBULATORY_CARE_PROVIDER_SITE_OTHER): Payer: Medicare Other | Admitting: Family Medicine

## 2015-10-21 ENCOUNTER — Ambulatory Visit (HOSPITAL_BASED_OUTPATIENT_CLINIC_OR_DEPARTMENT_OTHER)
Admission: RE | Admit: 2015-10-21 | Discharge: 2015-10-21 | Disposition: A | Discharge status: Home / Self Care | Payer: Medicare Other | Source: Ambulatory Visit | Attending: Family Medicine | Admitting: Family Medicine

## 2015-10-21 VITALS — BP 116/82 | Ht 65.0 in | Wt 150.0 lb

## 2015-10-21 DIAGNOSIS — M810 Age-related osteoporosis without current pathological fracture: Secondary | ICD-10-CM | POA: Diagnosis not present

## 2015-10-21 DIAGNOSIS — S82401G Unspecified fracture of shaft of right fibula, subsequent encounter for closed fracture with delayed healing: Secondary | ICD-10-CM

## 2015-10-21 DIAGNOSIS — L03115 Cellulitis of right lower limb: Secondary | ICD-10-CM | POA: Diagnosis not present

## 2015-10-21 DIAGNOSIS — S82401A Unspecified fracture of shaft of right fibula, initial encounter for closed fracture: Secondary | ICD-10-CM

## 2015-10-21 MED ORDER — CEPHALEXIN 500 MG PO CAPS: 500.0000 mg | ORAL_CAPSULE | Freq: Four times a day (QID) | ORAL | 0 refills | Status: DC

## 2015-10-21 NOTE — Patient Instructions (Addendum)
You have a distal fibula fracture. I'm concerned about you also having a skin infection here. Take Keflex 500mg  four times a day for 7 days. Vitamin C 500mg  daily as well. Elevate above your heart when possible. Icing 15 minutes at a time 3-4 times a day. Use the compression stockings regularly. Use the walker also. Follow up with me in 1 1/2 weeks for reevaluation. We will arrange for you to have a DEXA (bone density test) as well.

## 2015-10-21 NOTE — Telephone Encounter (Signed)
Patient informed. 

## 2015-10-21 NOTE — Telephone Encounter (Signed)
Please let her know it's 500mg  daily for 6 weeks.  Thanks!

## 2015-10-25 DIAGNOSIS — S82401A Unspecified fracture of shaft of right fibula, initial encounter for closed fracture: Secondary | ICD-10-CM | POA: Insufficient documentation

## 2015-10-25 DIAGNOSIS — L0390 Cellulitis, unspecified: Secondary | ICD-10-CM | POA: Insufficient documentation

## 2015-10-25 NOTE — Assessment & Plan Note (Signed)
independently reviewed radiographs and no displacement.  She also appears to have overlying cellulitis - more consistent with this than CRPS but will have patient take medication for both.  Keflex 500mg  qid, vitamin C 500mg  daily.  Icing, compression, elevation.  Continue with cam walker and regular walker to help with ambulation.  F/u in 1-1 1/2 weeks for reevaluation.  DEXA arranged as well.

## 2015-10-25 NOTE — Progress Notes (Signed)
PCP: PIAZZA, Nolon BussingMICHAEL J, MD  Subjective:   HPI: Patient is a 82 y.o. female here for right ankle injury.  Patient reports on 8/11 she accidentally inverted her right ankle - difficulty bearing weight after this. Went to ED and found to have a distal fibula fracture - placed in cam walker. Has been walking better since the injury. Pain level if 8/10 lateral ankle, sharp Better with rest, sitting. Denies fever, chills, sweats, numbness. Some redness of the area.  Past Medical History:  Diagnosis Date  . Hyperlipidemia   . Hypertension   . Thyroid disease     Current Outpatient Prescriptions on File Prior to Visit  Medication Sig Dispense Refill  . PRESCRIPTION MEDICATION Littanoprost eye drops .005     No current facility-administered medications on file prior to visit.     Past Surgical History:  Procedure Laterality Date  . ABDOMINAL HYSTERECTOMY    . TOTAL HIP ARTHROPLASTY Right     Allergies  Allergen Reactions  . Codeine Other (See Comments)  . Honey Bee Treatment [Bee Venom] Itching    fever  . Levaquin [Levofloxacin In D5w]   . Penicillins   . Sulfa Antibiotics     Social History   Social History  . Marital status: Widowed    Spouse name: N/A  . Number of children: N/A  . Years of education: N/A   Occupational History  . Not on file.   Social History Main Topics  . Smoking status: Never Smoker  . Smokeless tobacco: Never Used  . Alcohol use No  . Drug use: No  . Sexual activity: Not on file   Other Topics Concern  . Not on file   Social History Narrative  . No narrative on file    No family history on file.  BP 116/82   Ht 5\' 5"  (1.651 m)   Wt 150 lb (68 kg)   BMI 24.96 kg/m   Review of Systems: See HPI above.    Objective:  Physical Exam:  Gen: NAD, comfortable in exam room  Right foot/ankle: Mod swelling diffusely about ankle.  Redness as well from ankle up to ~2/3rds of the way down lower leg. Very limited motion 2/2  pain. TTP diffusely over red area, lateral malleolus. Pain with ant drawer and talar tilt but no laxity.  Deferred syndesmotic compression. Thompsons test negative. NV intact distally.    Left ankle: FROM without pain.  Assessment & Plan:  1. Right distal fibula fracture - independently reviewed radiographs and no displacement.  She also appears to have overlying cellulitis - more consistent with this than CRPS but will have patient take medication for both.  Keflex 500mg  qid, vitamin C 500mg  daily.  Icing, compression, elevation.  Continue with cam walker and regular walker to help with ambulation.  F/u in 1-1 1/2 weeks for reevaluation.  DEXA arranged as well.

## 2015-10-25 NOTE — Assessment & Plan Note (Signed)
independently reviewed radiographs and no displacement.  She also appears to have overlying cellulitis - more consistent with this than CRPS but will have patient take medication for both.  Keflex 500mg qid, vitamin C 500mg daily.  Icing, compression, elevation.  Continue with cam walker and regular walker to help with ambulation.  F/u in 1-1 1/2 weeks for reevaluation.  DEXA arranged as well. 

## 2015-10-28 ENCOUNTER — Telehealth: Payer: Self-pay | Admitting: Family Medicine

## 2015-10-28 NOTE — Telephone Encounter (Signed)
Generally she would take 1 tablet of the 600/400 twice a day for our purposes and this should be ok.  Does her family doctor have her on prescription vitamin D also?  For vitamin D deficiency prevention it should be adequate for her to take a single 1000 U vitamin D tablet in addition to the calcium/vitamin D 600/400 instructions above.  So she would take calcium/vitamin D 600/400 1 tablet twice a day AND Vitamin D 1000 unit capsule 1 capsule once a day

## 2015-10-28 NOTE — Telephone Encounter (Signed)
Spoke to patient and gave her information provided by the physician. 

## 2015-10-28 NOTE — Telephone Encounter (Signed)
She is supposed to take 1300 calcium and 800 vitamin D (per Dr. Rexene EdisonH) and her bottle reads vitamin D 400 and calcium 600 so can she take 2 calciums but how does she do the vitamin D bc her PCP wants her to take 2000 mg vitamin D.  Please call to clarify, thanks

## 2015-11-01 ENCOUNTER — Ambulatory Visit: Payer: Medicare Other | Admitting: Family Medicine

## 2015-11-12 ENCOUNTER — Other Ambulatory Visit (HOSPITAL_BASED_OUTPATIENT_CLINIC_OR_DEPARTMENT_OTHER): Payer: Self-pay | Admitting: Orthopaedic Surgery

## 2015-11-12 DIAGNOSIS — R52 Pain, unspecified: Secondary | ICD-10-CM

## 2015-11-15 ENCOUNTER — Ambulatory Visit (HOSPITAL_BASED_OUTPATIENT_CLINIC_OR_DEPARTMENT_OTHER)
Admission: RE | Admit: 2015-11-15 | Discharge: 2015-11-15 | Disposition: A | Discharge status: Home / Self Care | Payer: Medicare Other | Source: Ambulatory Visit | Attending: Orthopaedic Surgery | Admitting: Orthopaedic Surgery

## 2015-11-15 DIAGNOSIS — R52 Pain, unspecified: Secondary | ICD-10-CM | POA: Insufficient documentation

## 2015-11-15 DIAGNOSIS — S82831D Other fracture of upper and lower end of right fibula, subsequent encounter for closed fracture with routine healing: Secondary | ICD-10-CM | POA: Diagnosis not present

## 2015-11-15 DIAGNOSIS — X58XXXD Exposure to other specified factors, subsequent encounter: Secondary | ICD-10-CM | POA: Insufficient documentation

## 2015-12-16 ENCOUNTER — Other Ambulatory Visit (INDEPENDENT_AMBULATORY_CARE_PROVIDER_SITE_OTHER): Payer: Self-pay | Admitting: Orthopaedic Surgery

## 2015-12-16 DIAGNOSIS — T148XXA Other injury of unspecified body region, initial encounter: Secondary | ICD-10-CM

## 2015-12-20 ENCOUNTER — Ambulatory Visit (INDEPENDENT_AMBULATORY_CARE_PROVIDER_SITE_OTHER): Payer: Medicare Other | Admitting: Orthopaedic Surgery

## 2015-12-20 ENCOUNTER — Ambulatory Visit (HOSPITAL_BASED_OUTPATIENT_CLINIC_OR_DEPARTMENT_OTHER)
Admission: RE | Admit: 2015-12-20 | Discharge: 2015-12-20 | Disposition: A | Discharge status: Home / Self Care | Payer: Medicare Other | Source: Ambulatory Visit | Attending: Orthopaedic Surgery | Admitting: Orthopaedic Surgery

## 2015-12-20 ENCOUNTER — Other Ambulatory Visit (INDEPENDENT_AMBULATORY_CARE_PROVIDER_SITE_OTHER): Payer: Self-pay | Admitting: Orthopaedic Surgery

## 2015-12-20 DIAGNOSIS — X58XXXD Exposure to other specified factors, subsequent encounter: Secondary | ICD-10-CM | POA: Insufficient documentation

## 2015-12-20 DIAGNOSIS — R52 Pain, unspecified: Secondary | ICD-10-CM

## 2015-12-20 DIAGNOSIS — S89301D Unspecified physeal fracture of lower end of right fibula, subsequent encounter for fracture with routine healing: Secondary | ICD-10-CM | POA: Diagnosis not present

## 2015-12-20 DIAGNOSIS — T148XXA Other injury of unspecified body region, initial encounter: Secondary | ICD-10-CM

## 2015-12-20 DIAGNOSIS — M19011 Primary osteoarthritis, right shoulder: Secondary | ICD-10-CM | POA: Diagnosis not present

## 2015-12-20 DIAGNOSIS — M25511 Pain in right shoulder: Secondary | ICD-10-CM | POA: Diagnosis not present

## 2016-04-04 ENCOUNTER — Encounter (HOSPITAL_BASED_OUTPATIENT_CLINIC_OR_DEPARTMENT_OTHER): Payer: Self-pay | Admitting: *Deleted

## 2016-04-04 ENCOUNTER — Emergency Department (HOSPITAL_BASED_OUTPATIENT_CLINIC_OR_DEPARTMENT_OTHER): Payer: Medicare Other

## 2016-04-04 ENCOUNTER — Emergency Department (HOSPITAL_BASED_OUTPATIENT_CLINIC_OR_DEPARTMENT_OTHER)
Admission: EM | Admit: 2016-04-04 | Discharge: 2016-04-04 | Disposition: A | Discharge status: Home / Self Care | Payer: Medicare Other | Attending: Emergency Medicine | Admitting: Emergency Medicine

## 2016-04-04 ENCOUNTER — Telehealth (INDEPENDENT_AMBULATORY_CARE_PROVIDER_SITE_OTHER): Payer: Self-pay | Admitting: Radiology

## 2016-04-04 DIAGNOSIS — Z79899 Other long term (current) drug therapy: Secondary | ICD-10-CM | POA: Diagnosis not present

## 2016-04-04 DIAGNOSIS — Z7982 Long term (current) use of aspirin: Secondary | ICD-10-CM | POA: Insufficient documentation

## 2016-04-04 DIAGNOSIS — M79604 Pain in right leg: Secondary | ICD-10-CM | POA: Insufficient documentation

## 2016-04-04 DIAGNOSIS — I10 Essential (primary) hypertension: Secondary | ICD-10-CM | POA: Insufficient documentation

## 2016-04-04 DIAGNOSIS — M79661 Pain in right lower leg: Secondary | ICD-10-CM

## 2016-04-04 HISTORY — DX: Other fracture of unspecified lower leg, initial encounter for closed fracture: S82.899A

## 2016-04-04 LAB — BASIC METABOLIC PANEL
Anion gap: 8 (ref 5–15)
BUN: 28 mg/dL — ABNORMAL HIGH (ref 6–20)
CHLORIDE: 106 mmol/L (ref 101–111)
CO2: 26 mmol/L (ref 22–32)
CREATININE: 1.09 mg/dL — AB (ref 0.44–1.00)
Calcium: 9.8 mg/dL (ref 8.9–10.3)
GFR calc non Af Amer: 45 mL/min — ABNORMAL LOW (ref >60)
GFR, EST AFRICAN AMERICAN: 52 mL/min — AB (ref >60)
GLUCOSE: 106 mg/dL — AB (ref 65–99)
Potassium: 3.9 mmol/L (ref 3.5–5.1)
Sodium: 140 mmol/L (ref 135–145)

## 2016-04-04 LAB — CBC
HEMATOCRIT: 39.2 % (ref 36.0–46.0)
HEMOGLOBIN: 13.6 g/dL (ref 12.0–15.0)
MCH: 32.2 pg (ref 26.0–34.0)
MCHC: 34.7 g/dL (ref 30.0–36.0)
MCV: 92.7 fL (ref 78.0–100.0)
Platelets: 223 10*3/uL (ref 150–400)
RBC: 4.23 MIL/uL (ref 3.87–5.11)
RDW: 12.8 % (ref 11.5–15.5)
WBC: 6 10*3/uL (ref 4.0–10.5)

## 2016-04-04 MED ORDER — HYDROCODONE-ACETAMINOPHEN 5-325 MG PO TABS: 1.0000 | ORAL_TABLET | ORAL | 0 refills | Status: DC | PRN

## 2016-04-04 MED ORDER — HYDROCODONE-ACETAMINOPHEN 5-325 MG PO TABS
1.0000 | ORAL_TABLET | Freq: Once | ORAL | Status: AC
  Administered 2016-04-04: 1 via ORAL
  Filled 2016-04-04: qty 1

## 2016-04-04 NOTE — Telephone Encounter (Signed)
Can we open up a spot for her sometime this week please

## 2016-04-04 NOTE — Discharge Instructions (Signed)
Return to the ED with any concerns including increased pain, swelling of right leg, decreased level of alertness/lethargy, or any other alarming symptoms

## 2016-04-04 NOTE — Telephone Encounter (Signed)
Appt 04/05/16 @ 10:00

## 2016-04-04 NOTE — Telephone Encounter (Signed)
Ms. Dot LanesGamba is calling, she is in severe pain with her ankle that she broke last year.  She is having trouble walking on it.  And she has been  sched[facility].  But she stats that she can not wait that long.  Please all her back @ [phone] to advise.

## 2016-04-04 NOTE — ED Notes (Signed)
Patient transported to Ultrasound 

## 2016-04-04 NOTE — ED Triage Notes (Signed)
Pt reports right leg pain behind her knee and into her calf x 1 day.  Hx of right ankle fracture.  Pt ambulatory with a walker.  Pt very tender on palpation of the leg.

## 2016-04-04 NOTE — ED Notes (Signed)
Pt verbalizes understanding of d/c instructions and denies any further needs at this time. 

## 2016-04-04 NOTE — ED Provider Notes (Signed)
[facility]-EMERGENCY DEPT Provider Note   CSN: [id number] Arrival date & time: 04/04/16  1933  By signing my name below, I, Nelwyn Salisbury, attest that this documentation has been prepared under the direction and in the presence of Jerelyn Scott, MD . Electronically Signed: Nelwyn Salisbury, Scribe. 04/04/2016. 8:25 PM.  History   Chief Complaint Chief Complaint  Patient presents with  . Leg Pain   The history is provided by the patient and a relative. No language interpreter was used.    HPI Comments:  [name] is a 83 y.o. female with pmhx of right ankle fx who presents to the Emergency Department complaining of worsening, mild-moderate right leg pain onset 1 month ago. She describes her pain as beginning in her ankle but worsening to include her entire lower right leg from her knee downward. Her pain is worst just inferior to her anterior knee and behind the knee. Pt's relative states that the pt was ambulatory until 3 days ago. She denies any weakness or numbness. Pain is worse with flexion of knee.  No chest pain or shortness of breath.  There are no other associated systemic symptoms, there are no other alleviating or modifying factors.   Past Medical History:  Diagnosis Date  . Ankle fracture   . Hyperlipidemia   . Hypertension   . Thyroid disease     Patient Active Problem List   Diagnosis Date Noted  . Closed fracture of right fibula 10/25/2015  . Cellulitis 10/25/2015  . Cataract 10/07/2014  . Persistent miosis 10/07/2014  . Varicose veins of both lower extremities with pain 10/08/2013  . Glaucoma 06/25/2013  . Hypothyroidism 06/25/2013  . Vitamin D deficiency 06/25/2013  . Dermatitis 08/10/2012  . Essential hypertension 08/10/2012  . Hypercholesterolemia 08/10/2012  . Senile osteoporosis 08/10/2012    Past Surgical History:  Procedure Laterality Date  . ABDOMINAL HYSTERECTOMY    . TOTAL HIP ARTHROPLASTY Right     OB History    No data available       Home  Medications    Prior to Admission medications   Medication Sig Start Date End Date Taking? Authorizing Provider  metoprolol tartrate (LOPRESSOR) 25 MG tablet Take 25 mg by mouth 2 (two) times daily.   Yes Historical Provider, MD  aspirin EC 81 MG tablet Frequency:Daily   Dosage:0   MG  Instructions:  Note:Take 1 tablet daily 06/25/13   Historical Provider, MD  cetirizine (ZYRTEC) 10 MG tablet Take 10 mg by mouth.    Historical Provider, MD  Cholecalciferol (VITAMIN D3) 1000 units CAPS Frequency:Daily   Dosage:0   UNIT  Instructions:  Note:TAKE 1 CAPSULE Daily 06/25/13   Historical Provider, MD  enalapril (VASOTEC) 20 MG tablet Take 20 mg by mouth. 09/16/14   Historical Provider, MD  HYDROcodone-acetaminophen (NORCO/VICODIN) 5-325 MG tablet Take 1 tablet by mouth every 4 (four) hours as needed. 04/04/16   Jerelyn Scott, MD  latanoprost (XALATAN) 0.005 % ophthalmic solution Frequency:   Dosage:0.0     Instructions:  Note: 06/10/11   Historical Provider, MD  levothyroxine (SYNTHROID, LEVOTHROID) 88 MCG tablet Take by mouth. 01/06/15   Historical Provider, MD  lovastatin (MEVACOR) 20 MG tablet Take 20 mg by mouth. 02/03/15   Historical Provider, MD  PRESCRIPTION MEDICATION Littanoprost eye drops .005    Historical Provider, MD    Family History History reviewed. No pertinent family history.  Social History Social History  Substance Use Topics  . Smoking status: Never Smoker  . Smokeless  tobacco: Never Used  . Alcohol use No     Allergies   Codeine; Honey bee treatment [bee venom]; Levaquin [levofloxacin in d5w]; Penicillins; and Sulfa antibiotics   Review of Systems Review of Systems  Musculoskeletal: Positive for arthralgias.  Neurological: Negative for weakness and numbness.  All other systems reviewed and are negative.    Physical Exam Updated Vital Signs BP 160/79   Pulse 76   Temp 97.7 F (36.5 C) (Oral)   Resp 18   Ht 5\' 5"  (1.651 m)   Wt 68 kg   SpO2 97%   BMI 24.96  kg/m  Vitals reviewed Physical Exam Physical Examination: General appearance - alert, well appearing, and in no distress Mental status - alert, oriented to person, place, and time Eyes - no conjunctival injection, no scleral icterus Chest - clear to auscultation, no wheezes, rales or rhonchi, symmetric air entry Heart - normal rate, regular rhythm, normal S1, S2, no murmurs, rubs, clicks or gallops Neurological - alert, oriented, normal speech Musculoskeletal - ttp over right lateral knee and posterior knee in popliteal fossa, negative homan's sign,  no joint tenderness, deformity or swelling, no palpable cords Extremities - peripheral pulses normal, no swelling or  Edema of bilateral ankles, no clubbing or cyanosis Skin - normal coloration and turgor, no rashes  ED Treatments / Results  DIAGNOSTIC STUDIES:  Oxygen Saturation is 97% on RA, normal by my interpretation.    COORDINATION OF CARE:  8:29 PM Discussed treatment plan with pt at bedside which includes imaging to rule out a blood clot and pt agreed to plan.  Labs (all labs ordered are listed, but only abnormal results are displayed) Labs Reviewed  BASIC METABOLIC PANEL - Abnormal; Notable for the following:       Result Value   Glucose, Bld 106 (*)    BUN 28 (*)    Creatinine, Ser 1.09 (*)    GFR calc non Af Amer 45 (*)    GFR calc Af Amer 52 (*)    All other components within normal limits  CBC    EKG  EKG Interpretation None       Radiology US Venous Img Lower Unilateral Right  Result Date: 04/04/2016 CLINICAL DATA:  Right leg pain for 1 month. EXAM: RIGHT LOWER EXTREMITY VENOUS DOPPLER ULTRASOUND TECHNIQUE: Gray-scale sonography with graded compression, as well as color Doppler and duplex ultrasound were performed to evaluate the lower extremity deep venous systems from the level of the common femoral vein and including the common femoral, femoral, profunda femoral, popliteal and calf veins including the  posterior tibial, peroneal and gastrocnemius veins when visible. The superficial great saphenous vein was also interrogated. Spectral Doppler was utilized to evaluate flow at rest and with distal augmentation maneuvers in the common femoral, femoral and popliteal veins. COMPARISON:  None. FINDINGS: Contralateral Common Femoral Vein: Respiratory phasicity is normal and symmetric with the symptomatic side. No evidence of thrombus. Normal compressibility. Common Femoral Vein: No evidence of thrombus. Normal compressibility, respiratory phasicity and response to augmentation. Saphenofemoral Junction: No evidence of thrombus. Normal compressibility and flow on color Doppler imaging. Profunda Femoral Vein: No evidence of thrombus. Normal compressibility and flow on color Doppler imaging. Femoral Vein: No evidence of thrombus. Normal compressibility, respiratory phasicity and response to augmentation. Popliteal Vein: No evidence of thrombus. Normal compressibility, respiratory phasicity and response to augmentation. Calf Veins: No evidence of thrombus. Normal compressibility and flow on color Doppler imaging. Superficial Great Saphenous Vein: No evidence of thrombus.  Normal compressibility and flow on color Doppler imaging. Venous Reflux:  None. Other Findings:  None. IMPRESSION: No evidence of right lower extremity deep venous thrombosis. Electronically Signed   By: Rubye OaksMelanie  Ehinger M.D.   On: 04/04/2016 22:25   Xr Knee 1-2 Views Right  Result Date: 04/05/2016 AP bilateral knees a lateral view of the right knee: No acute fractures medial lateral joint lines are well maintained both knees. Lateral view of the right knee shows some mild patellofemoral changes. Dislocation of right knee.   Procedures Procedures (including critical care time)  Medications Ordered in ED Medications  HYDROcodone-acetaminophen (NORCO/VICODIN) 5-325 MG per tablet 1 tablet (1 tablet Oral Given 04/04/16 2105)     Initial Impression /  Assessment and Plan / ED Course  I have reviewed the triage vital signs and the nursing notes.  Pertinent labs & imaging results that were available during my care of the patient were reviewed by me and considered in my medical decision making (see chart for details).     Pt presenting with right lower extremity pain, primarily behind and in lateral right knee.  Pain has been gradually worsening over several weeks.  Due to recent ankle fracture and calf pain- dvt study obtained and this was negative.  Pt placed in knee sleeve for comfort.  She already has an appointment scheduled with [facility] for tomorrow morning.  Suspect muscle strain.  Discharged with strict return precautions.  Pt agreeable with plan.  Final Clinical Impressions(s) / ED Diagnoses   Final diagnoses:  Acute pain of right lower extremity    New Prescriptions Discharge Medication List as of 04/04/2016 10:30 PM    START taking these medications   Details  HYDROcodone-acetaminophen (NORCO/VICODIN) 5-325 MG tablet Take 1 tablet by mouth every 4 (four) hours as needed., Starting Tue 04/04/2016, Print      I personally performed the services described in this documentation, which was scribed in my presence. The recorded information has been reviewed and is accurate.      Jerelyn ScottMartha Linker, MD 04/05/16 [phone]

## 2016-04-05 ENCOUNTER — Encounter (INDEPENDENT_AMBULATORY_CARE_PROVIDER_SITE_OTHER): Payer: Self-pay | Admitting: Orthopaedic Surgery

## 2016-04-05 ENCOUNTER — Ambulatory Visit (INDEPENDENT_AMBULATORY_CARE_PROVIDER_SITE_OTHER): Payer: Medicare Other | Admitting: Physician Assistant

## 2016-04-05 ENCOUNTER — Ambulatory Visit (INDEPENDENT_AMBULATORY_CARE_PROVIDER_SITE_OTHER): Payer: Medicare Other

## 2016-04-05 DIAGNOSIS — M25561 Pain in right knee: Secondary | ICD-10-CM | POA: Diagnosis not present

## 2016-04-05 MED ORDER — METHYLPREDNISOLONE ACETATE 40 MG/ML IJ SUSP
40.0000 mg | INTRAMUSCULAR | Status: AC | PRN
  Administered 2016-04-05: 40 mg via INTRA_ARTICULAR

## 2016-04-05 MED ORDER — LIDOCAINE HCL 1 % IJ SOLN
3.0000 mL | INTRAMUSCULAR | Status: AC | PRN
  Administered 2016-04-05: 3 mL

## 2016-04-05 NOTE — Progress Notes (Signed)
Office Visit Note   Patient: [name]           Date of Birth: 1929/01/05           MRN: [id number] Visit Date: 04/05/2016              Requested by: Nadara Eaton, MD No address on file PCP: Nadara Eaton, MD   Assessment & Plan: Visit Diagnoses:  1. Acute pain of right knee     Plan: Quad strengthening exercises are shown and patient will perform these daily. Follow up in 2 weeks' check progress lack of.  Follow-Up Instructions: Return in about 2 weeks (around 04/19/2016).   Orders:  Orders Placed This Encounter  Procedures  . Large Joint Injection/Arthrocentesis  . XR Knee 1-2 Views Right   No orders of the defined types were placed in this encounter.     Procedures: Large Joint Inj Date/Time: 04/05/2016 10:52 AM Performed by: Kirtland Bouchard Authorized by: Kirtland Bouchard   Consent Given by:  Patient Indications:  Pain Location:  Knee Site:  R knee Needle Size:  22 G Approach:  Anterolateral Ultrasound Guidance: No   Fluoroscopic Guidance: No   Medications:  40 mg methylPREDNISolone acetate 40 MG/ML; 3 mL lidocaine 1 % Aspiration Attempted: No   Aspirate amount (mL):  20 Aspirate:  Yellow and blood-tinged Patient tolerance:  Patient tolerated the procedure well with no immediate complications     Clinical Data: No additional findings.   Subjective: Chief Complaint  Patient presents with  . Right Leg - Pain    HPI In today with a 2 week history of right knee pain. No known injury. She does report doing some water aerobics and pull exercises prior to having the pain in the knee. Also states she had some pain in her right foot prior to the knee pain beginning. She's having no real radicular symptoms down the leg. Pain is mostly lateral aspect of the right knee she notes decreased range of motion of the knee and pain with changing trying to move the knee she is walking with a walker. Had some swelling of her right knee. When a Doppler of  her right lower leg yesterday which was negative for DVT or Baker cyst. Denies any mechanical symptoms of the right knee  Review of Systems See HPI Objective: Vital Signs: There were no vitals taken for this visit.  Physical Exam  Constitutional: She is oriented to person, place, and time. She appears well-developed and well-nourished. No distress.  Cardiovascular: Intact distal pulses.   Neurological: She is alert and oriented to person, place, and time.    Ortho Exam Right knee tenderness along the lateral joint line. Significant guarding with any range of motion attempts of the right knee. Passively full extension extension and flex her to 90. She has difficulty getting on and off the exam table due to the knee pain. Right knee no rashes skin lesions or erythema. Positive for mild effusion. Specialty Comments:  No specialty comments available.  Imaging: US Venous Img Lower Unilateral Right  Result Date: 04/04/2016 CLINICAL DATA:  Right leg pain for 1 month. EXAM: RIGHT LOWER EXTREMITY VENOUS DOPPLER ULTRASOUND TECHNIQUE: Gray-scale sonography with graded compression, as well as color Doppler and duplex ultrasound were performed to evaluate the lower extremity deep venous systems from the level of the common femoral vein and including the common femoral, femoral, profunda femoral, popliteal and calf veins including the posterior tibial, peroneal and  gastrocnemius veins when visible. The superficial great saphenous vein was also interrogated. Spectral Doppler was utilized to evaluate flow at rest and with distal augmentation maneuvers in the common femoral, femoral and popliteal veins. COMPARISON:  None. FINDINGS: Contralateral Common Femoral Vein: Respiratory phasicity is normal and symmetric with the symptomatic side. No evidence of thrombus. Normal compressibility. Common Femoral Vein: No evidence of thrombus. Normal compressibility, respiratory phasicity and response to augmentation.  Saphenofemoral Junction: No evidence of thrombus. Normal compressibility and flow on color Doppler imaging. Profunda Femoral Vein: No evidence of thrombus. Normal compressibility and flow on color Doppler imaging. Femoral Vein: No evidence of thrombus. Normal compressibility, respiratory phasicity and response to augmentation. Popliteal Vein: No evidence of thrombus. Normal compressibility, respiratory phasicity and response to augmentation. Calf Veins: No evidence of thrombus. Normal compressibility and flow on color Doppler imaging. Superficial Great Saphenous Vein: No evidence of thrombus. Normal compressibility and flow on color Doppler imaging. Venous Reflux:  None. Other Findings:  None. IMPRESSION: No evidence of right lower extremity deep venous thrombosis. Electronically Signed   By: Rubye OaksMelanie  Ehinger M.D.   On: 04/04/2016 22:25   Xr Knee 1-2 Views Right  Result Date: 04/05/2016 AP bilateral knees a lateral view of the right knee: No acute fractures medial lateral joint lines are well maintained both knees. Lateral view of the right knee shows some mild patellofemoral changes. Dislocation of right knee.    PMFS History: Patient Active Problem List   Diagnosis Date Noted  . Closed fracture of right fibula 10/25/2015  . Cellulitis 10/25/2015  . Cataract 10/07/2014  . Persistent miosis 10/07/2014  . Varicose veins of both lower extremities with pain 10/08/2013  . Glaucoma 06/25/2013  . Hypothyroidism 06/25/2013  . Vitamin D deficiency 06/25/2013  . Dermatitis 08/10/2012  . Essential hypertension 08/10/2012  . Hypercholesterolemia 08/10/2012  . Senile osteoporosis 08/10/2012   Past Medical History:  Diagnosis Date  . Ankle fracture   . Hyperlipidemia   . Hypertension   . Thyroid disease     No family history on file.  Past Surgical History:  Procedure Laterality Date  . ABDOMINAL HYSTERECTOMY    . TOTAL HIP ARTHROPLASTY Right    Social History   Occupational History  . Not  on file.   Social History Main Topics  . Smoking status: Never Smoker  . Smokeless tobacco: Never Used  . Alcohol use No  . Drug use: No  . Sexual activity: Not on file

## 2016-04-10 ENCOUNTER — Ambulatory Visit (INDEPENDENT_AMBULATORY_CARE_PROVIDER_SITE_OTHER): Payer: Medicare Other | Admitting: Orthopaedic Surgery

## 2016-04-19 ENCOUNTER — Ambulatory Visit (INDEPENDENT_AMBULATORY_CARE_PROVIDER_SITE_OTHER): Payer: Medicare Other | Admitting: Orthopaedic Surgery

## 2016-04-19 DIAGNOSIS — M25561 Pain in right knee: Secondary | ICD-10-CM | POA: Diagnosis not present

## 2016-04-19 NOTE — Progress Notes (Signed)
The patient is now 2 weeks out from a intra-articular steroid injection in her right knee. She says that is helped greatly and she has no pain on her knee. She is a better result. She's been also recovering from an ankle fracture that occurred on the right side last August in severity of this does not bother her at all. She walks with a cane and is been common for even before the fracture.  Examination of her right knee shows no effusion at all. Her range of motion is full and pain-free. Her knee is ligamentously stable. She also has full range of motion of her right ankle without pain and is no pain on stressing the knee or the ankle.  At this point she'll follow-up as needed since she is doing so well. If she has any issues at all she knows to give us a call.

## 2016-04-24 ENCOUNTER — Telehealth (INDEPENDENT_AMBULATORY_CARE_PROVIDER_SITE_OTHER): Payer: Self-pay | Admitting: Orthopaedic Surgery

## 2016-04-24 NOTE — Telephone Encounter (Signed)
OK to do thanks. Does not need ABX before routing teeth cleaning.

## 2016-04-24 NOTE — Telephone Encounter (Signed)
Ok for letter

## 2016-04-24 NOTE — Telephone Encounter (Signed)
Patient called needing a letter wrote out to her dentist giving the okay to do a dental procedure. If you could mail it to [address] [place] [phone]. Her call back # is [phone]

## 2016-05-09 ENCOUNTER — Telehealth (INDEPENDENT_AMBULATORY_CARE_PROVIDER_SITE_OTHER): Payer: Self-pay | Admitting: Orthopaedic Surgery

## 2016-05-09 NOTE — Telephone Encounter (Signed)
Patient called asking about the letter that she wanted mailed to her home address about not needing any medication before a dental procedure. Wanting to know if its been mailed? CB # [phone]

## 2016-05-10 NOTE — Telephone Encounter (Signed)
I called patient. Letter originally approved by Dr. Ophelia CharterYates but system did not route back. I am mailing letter today.

## 2016-06-26 ENCOUNTER — Ambulatory Visit (INDEPENDENT_AMBULATORY_CARE_PROVIDER_SITE_OTHER): Payer: Medicare Other | Admitting: Physician Assistant

## 2016-06-26 ENCOUNTER — Encounter (INDEPENDENT_AMBULATORY_CARE_PROVIDER_SITE_OTHER): Payer: Self-pay | Admitting: Physician Assistant

## 2016-06-26 ENCOUNTER — Telehealth (INDEPENDENT_AMBULATORY_CARE_PROVIDER_SITE_OTHER): Payer: Self-pay | Admitting: Radiology

## 2016-06-26 DIAGNOSIS — M25561 Pain in right knee: Secondary | ICD-10-CM | POA: Diagnosis not present

## 2016-06-26 DIAGNOSIS — M7061 Trochanteric bursitis, right hip: Secondary | ICD-10-CM | POA: Diagnosis not present

## 2016-06-26 NOTE — Telephone Encounter (Signed)
Patient is calling due to severe leg pain and she is going out of town next week and she would like to be seen sometime this week by Dr. Magnus Ivan or Bronson Curb.  I did not see anything available on the schedule.  Please call patient to advise---if she don't answer please leave her a message.

## 2016-06-26 NOTE — Telephone Encounter (Signed)
Can you do me a favor and see if she will come see Bronson Curb today and open a spot for her?

## 2016-06-26 NOTE — Telephone Encounter (Signed)
LMOM TO RETURN CALL.

## 2016-06-26 NOTE — Progress Notes (Signed)
   Office Visit Note   Patient: [name]           Date of Birth: 1929/01/12           MRN: [id number] Visit Date: 06/26/2016              Requested by: Nadara Eaton, MD No address on file PCP: Nadara Eaton, MD   Assessment & Plan: Visit Diagnoses:  1. Acute pain of right knee   2. Trochanteric bursitis, right hip     Plan: MRI of right knee to evaluate cartilage and rule out meniscal tear. She'll follow-up after the MRI to go over results and discuss further treatment.  Follow-Up Instructions: Return for follow up after MRI.   Orders:  Orders Placed This Encounter  Procedures  . MR Knee Right w/o contrast   No orders of the defined types were placed in this encounter.     Procedures: No procedures performed   Clinical Data: No additional findings.   Subjective: Chief Complaint  Patient presents with  . Right Knee - Pain, Edema    HPI  [name] returns today due to right leg pain and knee pain for over a week. Status post right knee cortisone injection 04/05/2016. She feels that she scuff fluid on the knee again stiff. She has difficulty raising her leg. She is wondering if she could benefit from another injection and aspiration as this is helped in the past. Radiographs of her knee back in January showed no significant arthritic changes or fracture. She's had no new injury to the knee. Pain did start back in January after doing water aerobics  Review of Systems See history of present illness  Objective: Vital Signs: There were no vitals taken for this visit.  Physical Exam Well-developed well-nourished female in no acute distress. Mood and affect appropriate Ortho Exam Right knee positive effusion. No abnormal warmth or erythema of either knees. She has pain with forced flexion of the right knee. Positive Mc Murray's right knee no instability with valgus varus stressing of either knee. Right knee with tenderness along the lateral joint line.  Right calf supple nontender. Good range of motion of the right hip without pain. Tenderness over the right trochanteric region Specialty Comments:  No specialty comments available.  Imaging: No results found.   PMFS History: Patient Active Problem List   Diagnosis Date Noted  . Acute pain of right knee 06/26/2016  . Closed fracture of right fibula 10/25/2015  . Cellulitis 10/25/2015  . Cataract 10/07/2014  . Persistent miosis 10/07/2014  . Varicose veins of both lower extremities with pain 10/08/2013  . Glaucoma 06/25/2013  . Hypothyroidism 06/25/2013  . Vitamin D deficiency 06/25/2013  . Dermatitis 08/10/2012  . Essential hypertension 08/10/2012  . Hypercholesterolemia 08/10/2012  . Senile osteoporosis 08/10/2012   Past Medical History:  Diagnosis Date  . Ankle fracture   . Hyperlipidemia   . Hypertension   . Thyroid disease     No family history on file.  Past Surgical History:  Procedure Laterality Date  . ABDOMINAL HYSTERECTOMY    . TOTAL HIP ARTHROPLASTY Right    Social History   Occupational History  . Not on file.   Social History Main Topics  . Smoking status: Never Smoker  . Smokeless tobacco: Never Used  . Alcohol use No  . Drug use: No  . Sexual activity: Not on file

## 2016-07-01 ENCOUNTER — Ambulatory Visit (HOSPITAL_BASED_OUTPATIENT_CLINIC_OR_DEPARTMENT_OTHER)
Admission: RE | Admit: 2016-07-01 | Discharge: 2016-07-01 | Disposition: A | Discharge status: Home / Self Care | Payer: Medicare Other | Source: Ambulatory Visit | Attending: Physician Assistant | Admitting: Physician Assistant

## 2016-07-01 DIAGNOSIS — M25461 Effusion, right knee: Secondary | ICD-10-CM | POA: Diagnosis not present

## 2016-07-01 DIAGNOSIS — M25561 Pain in right knee: Secondary | ICD-10-CM | POA: Insufficient documentation

## 2016-07-01 DIAGNOSIS — M949 Disorder of cartilage, unspecified: Secondary | ICD-10-CM | POA: Insufficient documentation

## 2016-07-15 ENCOUNTER — Other Ambulatory Visit (HOSPITAL_BASED_OUTPATIENT_CLINIC_OR_DEPARTMENT_OTHER): Payer: Medicare Other

## 2016-07-24 ENCOUNTER — Ambulatory Visit (INDEPENDENT_AMBULATORY_CARE_PROVIDER_SITE_OTHER): Payer: Medicare Other | Admitting: Orthopaedic Surgery

## 2016-07-24 DIAGNOSIS — M25511 Pain in right shoulder: Secondary | ICD-10-CM

## 2016-07-24 DIAGNOSIS — M25561 Pain in right knee: Secondary | ICD-10-CM

## 2016-07-24 DIAGNOSIS — G8929 Other chronic pain: Secondary | ICD-10-CM

## 2016-07-24 MED ORDER — LIDOCAINE HCL 1 % IJ SOLN
3.0000 mL | INTRAMUSCULAR | Status: AC | PRN
  Administered 2016-07-24: 3 mL

## 2016-07-24 MED ORDER — METHYLPREDNISOLONE ACETATE 40 MG/ML IJ SUSP
40.0000 mg | INTRAMUSCULAR | Status: AC | PRN
  Administered 2016-07-24: 40 mg via INTRA_ARTICULAR

## 2016-07-24 NOTE — Progress Notes (Signed)
Office Visit Note   Patient: [name]           Date of Birth: 04/29/1928           MRN: [id number] Visit Date: 07/24/2016              Requested by: Nadara Eaton, MD No address on file PCP: Nadara Eaton, MD   Assessment & Plan: Visit Diagnoses:  1. Acute pain of right knee   2. Chronic right shoulder pain       Plan: I gave her note to give the therapist at her nursing facility to work on right shoulder mobility as well as right lower extremity strengthening. She knows to always wait at least 3 months in between injections. There is really nothing else to offer her other than strengthening exercises physical therapy and an occasional injection. She understands as well. All questions were encouraged and answered.  Follow-Up Instructions: Return if symptoms worsen or fail to improve.   Orders:  Orders Placed This Encounter  Procedures  . Large Joint Injection/Arthrocentesis  . Large Joint Injection/Arthrocentesis   No orders of the defined types were placed in this encounter.     Procedures: Large Joint Inj Date/Time: 07/24/2016 9:17 AM Performed by: Kathryne Hitch Authorized by: Doneen Poisson Y   Location:  Knee Site:  R knee Ultrasound Guidance: No   Fluoroscopic Guidance: No   Arthrogram: No   Medications:  3 mL lidocaine 1 %; 40 mg methylPREDNISolone acetate 40 MG/ML Large Joint Inj Date/Time: 07/24/2016 9:17 AM Performed by: Kathryne Hitch Authorized by: Kathryne Hitch   Location:  Shoulder Site:  R subacromial bursa Ultrasound Guidance: No   Fluoroscopic Guidance: No   Arthrogram: No   Medications:  3 mL lidocaine 1 %; 40 mg methylPREDNISolone acetate 40 MG/ML     Clinical Data: No additional findings.   Subjective: No chief complaint on file. The patient is well-known to me. She comes in for follow-up of MRI of her right knee. She is also having right shoulder problems and we've injected her  shoulder before. She's very active 83 year old and does stay at a nursing facility in the independent living. She says she can have physical therapy at their. She does with a cane. I do feel that it some point she developed trochanteric bursitis and IT band syndrome of her knee on the right side doing with an ankle fracture that she had had. She still a a cane in her biggest complaints are right leg pain and weakness as well as right shoulder pain  HPI  Review of Systems She currently denies any headache, chest pain, short of breath, fever, chills, nausea, vomiting.  Objective: Vital Signs: There were no vitals taken for this visit.  Physical Exam She is alert and oriented 3 and in no acute distress Ortho Exam Examination of her right shoulder deathly shows an impingement as well as deficits the rotator cuff. She is using her deltoids more to abduct her shoulder. There is definite weakness in the shoulder. As far as her knee and exam go she has a mild effusion. She has posterior lateral tenderness and tenderness over the IT band and the trochanteric area. Range of motion is full. The knee feels ligamentously stable. Specialty Comments:  No specialty comments available.  Imaging: No results found. The MRI of her right knee is independent reviewed and it does show mild arthritic changes in the knee but no tear. There is  a moderate effusion but the ligaments are all intact and stable. Most her arthritis is lateral and at the patellofemoral joint.  PMFS History: Patient Active Problem List   Diagnosis Date Noted  . Chronic right shoulder pain 07/24/2016  . Acute pain of right knee 06/26/2016  . Closed fracture of right fibula 10/25/2015  . Cellulitis 10/25/2015  . Cataract 10/07/2014  . Persistent miosis 10/07/2014  . Varicose veins of both lower extremities with pain 10/08/2013  . Glaucoma 06/25/2013  . Hypothyroidism 06/25/2013  . Vitamin D deficiency 06/25/2013  . Dermatitis  08/10/2012  . Essential hypertension 08/10/2012  . Hypercholesterolemia 08/10/2012  . Senile osteoporosis 08/10/2012   Past Medical History:  Diagnosis Date  . Ankle fracture   . Hyperlipidemia   . Hypertension   . Thyroid disease     No family history on file.  Past Surgical History:  Procedure Laterality Date  . ABDOMINAL HYSTERECTOMY    . TOTAL HIP ARTHROPLASTY Right    Social History   Occupational History  . Not on file.   Social History Main Topics  . Smoking status: Never Smoker  . Smokeless tobacco: Never Used  . Alcohol use No  . Drug use: No  . Sexual activity: Not on file

## 2016-10-23 ENCOUNTER — Ambulatory Visit (INDEPENDENT_AMBULATORY_CARE_PROVIDER_SITE_OTHER): Payer: Medicare Other | Admitting: Orthopaedic Surgery

## 2017-01-02 ENCOUNTER — Ambulatory Visit (INDEPENDENT_AMBULATORY_CARE_PROVIDER_SITE_OTHER): Payer: Medicare Other | Admitting: Physician Assistant

## 2017-01-02 DIAGNOSIS — M25511 Pain in right shoulder: Secondary | ICD-10-CM

## 2017-01-02 DIAGNOSIS — G8929 Other chronic pain: Secondary | ICD-10-CM | POA: Diagnosis not present

## 2017-01-02 DIAGNOSIS — M1711 Unilateral primary osteoarthritis, right knee: Secondary | ICD-10-CM

## 2017-01-02 MED ORDER — METHYLPREDNISOLONE ACETATE 40 MG/ML IJ SUSP
40.0000 mg | INTRAMUSCULAR | Status: AC | PRN
  Administered 2017-01-02: 40 mg via INTRA_ARTICULAR

## 2017-01-02 MED ORDER — LIDOCAINE HCL 1 % IJ SOLN
3.0000 mL | INTRAMUSCULAR | Status: AC | PRN
  Administered 2017-01-02: 3 mL

## 2017-01-02 NOTE — Progress Notes (Signed)
   Procedure Note  Patient: [name]             Date of Birth: 10/18/1928           MRN: [id number]             Visit Date: 01/02/2017 [name] returns today due to right shoulder pain and right knee pain and swelling.  States she has pain in the shoulder with trying to do overhead activities.  He has had chronic pain in the shoulder and has had injections in the past last one being in May.  She also has mild arthritis of the right knee and has swelling on the knee again.  She denies any catching locking or giving way or painful popping of the right knee.  States that the knee just feels full.  She states the physical therapy actually made things worse particularly with her shoulder.  Review of systems: No fevers, chills shortness of breath, chest pain or mechanical symptoms of the right knee.  Positive for right knee swelling.  Positive for right shoulder pain.  Physical exam right knee she has full extension full flexion.  Slight effusion.  No abnormal warmth erythema.  No instability of the knee. Bilateral shoulders she has 5 out of 5 strength with internal rotation against resistance.  She has 3 out of 5 strength with external rotation of the right shoulder 5 out of 5 for the left shoulder.  Forward flexion right shoulder actively 200 1020 degrees passively angry and 140 degrees.  Procedures: Visit Diagnoses: Chronic right shoulder pain  Primary osteoarthritis of right knee  Large Joint Inj Date/Time: 01/02/2017 9:03 AM Performed by: Kirtland BouchardLARK, Minard Millirons W Authorized by: Kirtland BouchardLARK, Karthikeya Funke W   Consent Given by:  Patient Indications:  Pain Location:  Shoulder Site:  R subacromial bursa Needle Size:  22 G Needle Length:  1.5 inches Approach:  Anterolateral Ultrasound Guidance: No   Fluoroscopic Guidance: No   Arthrogram: No   Medications:  40 mg methylPREDNISolone acetate 40 MG/ML; 3 mL lidocaine 1 % Aspiration Attempted: No   Patient tolerance:  Patient tolerated the procedure well  with no immediate complications Large Joint Inj Date/Time: 01/02/2017 9:03 AM Performed by: Kirtland BouchardLARK, Malaijah Houchen W Authorized by: Kirtland BouchardLARK, Felice Hope W   Consent Given by:  Patient Site marked: the procedure site was marked   Indications:  Pain Location:  Knee Site:  R knee Needle Size:  22 G Approach:  Superolateral Ultrasound Guidance: No   Fluoroscopic Guidance: No   Medications:  40 mg methylPREDNISolone acetate 40 MG/ML; 3 mL lidocaine 1 % Aspiration Attempted: Yes   Aspirate amount (mL):  15 Aspirate:  Blood-tinged Patient tolerance:  Patient tolerated the procedure well with no immediate complications   Plan: We will see her back on an as-needed basis.  She understands that she can have cortisone injections both the knee and the shoulder no more often than 3 months.  She is not interested in any type of surgical intervention for either of these problems.

## 2017-03-21 ENCOUNTER — Encounter (INDEPENDENT_AMBULATORY_CARE_PROVIDER_SITE_OTHER): Payer: Self-pay | Admitting: Physician Assistant

## 2017-03-21 ENCOUNTER — Ambulatory Visit (INDEPENDENT_AMBULATORY_CARE_PROVIDER_SITE_OTHER): Payer: Medicare Other | Admitting: Physician Assistant

## 2017-03-21 DIAGNOSIS — M1711 Unilateral primary osteoarthritis, right knee: Secondary | ICD-10-CM | POA: Diagnosis not present

## 2017-03-21 MED ORDER — METHYLPREDNISOLONE ACETATE 40 MG/ML IJ SUSP
40.0000 mg | INTRAMUSCULAR | Status: AC | PRN
  Administered 2017-03-21: 40 mg via INTRA_ARTICULAR

## 2017-03-21 MED ORDER — LIDOCAINE HCL 1 % IJ SOLN
5.0000 mL | INTRAMUSCULAR | Status: AC | PRN
  Administered 2017-03-21: 5 mL

## 2017-03-21 NOTE — Progress Notes (Signed)
Office Visit Note   Patient: [name]           Date of Birth: 1928-07-06           MRN: [id number] Visit Date: 03/21/2017              Requested by: Nadara Eaton, MD No address on file PCP: Nadara Eaton, MD   Assessment & Plan: Visit Diagnoses:  1. Primary osteoarthritis of right knee     Plan: We will try to gain approval for a supplemental injection for right knee.  Have her continue work on quad strengthening knee.  Follow-up when supplemental injection is available.  Follow-Up Instructions: Return for Supplemental injection.   Orders:  Orders Placed This Encounter  Procedures  . Large Joint Inj   No orders of the defined types were placed in this encounter.     Procedures: Large Joint Inj: R knee on 03/21/2017 8:25 PM Indications: pain Details: 22 G 1.5 in needle, anterolateral approach  Arthrogram: No  Medications: 40 mg methylPREDNISolone acetate 40 MG/ML; 5 mL lidocaine 1 % Aspirate: 15 mL yellow Outcome: tolerated well, no immediate complications Procedure, treatment alternatives, risks and benefits explained, specific risks discussed. Consent was given by the patient. Immediately prior to procedure a time out was called to verify the correct patient, procedure, equipment, support staff and site/side marked as required. Patient was prepped and draped in the usual sterile fashion.       Clinical Data: No additional findings.   Subjective: Chief Complaint  Patient presents with  . Right Knee - Pain    HPI [name] returns today due to her right knee pain .  She states that her knee did well after the cortisone injection from 01/02/2017 until around Christmas.  Now she is having increased pain in the knee.  She is having ambulate with a cane due to the knee pain.  He is having swelling in the knee. She had an MRI back in April 2018 and this showed high-grade partial-thickness cartilage loss of the lateral patella femoral compartment and  a moderate joint effusion.  No discrete meniscal tear there was some degenerative change of the medial meniscus.  Lateral compartment with mild cartilage irregularity of the lateral tibial plateau.   Review of Systems See HPI   Objective: Vital Signs: There were no vitals taken for this visit.  Physical Exam  Constitutional: She is oriented to person, place, and time. She appears well-developed and well-nourished. No distress.  Pulmonary/Chest: Effort normal.  Neurological: She is alert and oriented to person, place, and time.  Skin: She is not diaphoretic.  Psychiatric: She has a normal mood and affect.    Ortho Exam Right knee slight effusion.  She has full extension.  Flexion to around 105 degrees.  No abnormal warmth erythema.  No instability valgus varus stressing.  Tenderness over the lateral joint line. Specialty Comments:  No specialty comments available.  Imaging: No results found.   PMFS History: Patient Active Problem List   Diagnosis Date Noted  . Chronic right shoulder pain 07/24/2016  . Acute pain of right knee 06/26/2016  . Closed fracture of right fibula 10/25/2015  . Cellulitis 10/25/2015  . Cataract 10/07/2014  . Persistent miosis 10/07/2014  . Varicose veins of both lower extremities with pain 10/08/2013  . Glaucoma 06/25/2013  . Hypothyroidism 06/25/2013  . Vitamin D deficiency 06/25/2013  . Dermatitis 08/10/2012  . Essential hypertension 08/10/2012  . Hypercholesterolemia 08/10/2012  .  Senile osteoporosis 08/10/2012   Past Medical History:  Diagnosis Date  . Ankle fracture   . Hyperlipidemia   . Hypertension   . Thyroid disease     History reviewed. No pertinent family history.  Past Surgical History:  Procedure Laterality Date  . ABDOMINAL HYSTERECTOMY    . TOTAL HIP ARTHROPLASTY Right    Social History   Occupational History  . Not on file  Tobacco Use  . Smoking status: Never Smoker  . Smokeless tobacco: Never Used  Substance and  Sexual Activity  . Alcohol use: No  . Drug use: No  . Sexual activity: Not on file

## 2017-04-09 ENCOUNTER — Telehealth (INDEPENDENT_AMBULATORY_CARE_PROVIDER_SITE_OTHER): Payer: Self-pay | Admitting: Orthopaedic Surgery

## 2017-04-09 ENCOUNTER — Telehealth (INDEPENDENT_AMBULATORY_CARE_PROVIDER_SITE_OTHER): Payer: Self-pay | Admitting: Physician Assistant

## 2017-04-09 NOTE — Telephone Encounter (Signed)
Pt would like to know is her order for Siyinvc ready,from what the pt stated to me [facility] Morrie Sheldonshley if you have any questions about call you can message me back.

## 2017-04-10 NOTE — Telephone Encounter (Signed)
She's good to go We can schedule her for appt

## 2017-04-24 ENCOUNTER — Ambulatory Visit (INDEPENDENT_AMBULATORY_CARE_PROVIDER_SITE_OTHER): Payer: Medicare Other | Admitting: Orthopaedic Surgery

## 2017-04-30 ENCOUNTER — Ambulatory Visit (INDEPENDENT_AMBULATORY_CARE_PROVIDER_SITE_OTHER): Payer: Medicare Other | Admitting: Orthopaedic Surgery

## 2017-05-02 ENCOUNTER — Ambulatory Visit (INDEPENDENT_AMBULATORY_CARE_PROVIDER_SITE_OTHER): Payer: Medicare Other | Admitting: Orthopaedic Surgery

## 2017-05-02 ENCOUNTER — Encounter (INDEPENDENT_AMBULATORY_CARE_PROVIDER_SITE_OTHER): Payer: Self-pay | Admitting: Orthopaedic Surgery

## 2017-05-02 DIAGNOSIS — M1711 Unilateral primary osteoarthritis, right knee: Secondary | ICD-10-CM

## 2017-05-02 MED ORDER — HYLAN G-F 20 48 MG/6ML IX SOSY
48.0000 mg | PREFILLED_SYRINGE | INTRA_ARTICULAR | Status: AC | PRN
  Administered 2017-05-02: 48 mg via INTRA_ARTICULAR

## 2017-05-02 NOTE — Progress Notes (Signed)
   Procedure Note  Patient: [name]             Date of Birth: 10/15/1928           MRN: [id number]             Visit Date: 05/02/2017  Procedures: Visit Diagnoses: Unilateral primary osteoarthritis, right knee  Large Joint Inj: R knee on 05/02/2017 3:49 PM Indications: pain and diagnostic evaluation Details: 22 G 1.5 in needle, superolateral approach  Arthrogram: No  Medications: 48 mg Hylan 48 MG/6ML Outcome: tolerated well, no immediate complications Procedure, treatment alternatives, risks and benefits explained, specific risks discussed. Consent was given by the patient. Immediately prior to procedure a time out was called to verify the correct patient, procedure, equipment, support staff and site/side marked as required. Patient was prepped and draped in the usual sterile fashion.    The patient is a very pleasant 84 year old female who is here today for scheduled Synvisc 1 injection of hyaluronic acid into her right knee to treat moderate osteoarthritis and osteoarthritis pain.  She has had injections similar in the past.  She has had steroid already as well.  She is worked on activity modification and does use a cane as assistive device.  We had a long and thorough discussion about the risk and benefits of these injections and she did wish to proceed.  She tolerated well.  All questions and concerns were answered and addressed.  We will see her back in 3 months to place a steroid injection in her right knee if needed.

## 2017-07-31 ENCOUNTER — Encounter (INDEPENDENT_AMBULATORY_CARE_PROVIDER_SITE_OTHER): Payer: Self-pay | Admitting: Orthopaedic Surgery

## 2017-07-31 ENCOUNTER — Ambulatory Visit (INDEPENDENT_AMBULATORY_CARE_PROVIDER_SITE_OTHER): Payer: Medicare Other | Admitting: Orthopaedic Surgery

## 2017-07-31 DIAGNOSIS — M1711 Unilateral primary osteoarthritis, right knee: Secondary | ICD-10-CM | POA: Diagnosis not present

## 2017-07-31 NOTE — Progress Notes (Signed)
The patient is status post a Synvisc 1 injection in her right knee to treat osteoarthritis pain.  This was done 3 months ago.  She is 84 years old.  She ambulates using a cane.  She says her knee is doing great and has no issues at all with it.  On exam she has no knee pain whatsoever her right knee.  Her range of motion is full.  The knee feels grossly stable.  She does have some pain over the trochanteric area of her right hip.  She has a previous right hip replacement done about 8 to 9 years ago by Dr. Ophelia Charter.  On exam this seems to be only some bursitis.  I did show her stretching exercises to try for her hip.  All questions concerns were answered and addressed.  She knows to hold off to any other injection only if it starts bothering her again.

## 2017-09-24 ENCOUNTER — Ambulatory Visit (INDEPENDENT_AMBULATORY_CARE_PROVIDER_SITE_OTHER): Payer: Medicare Other | Admitting: Orthopaedic Surgery

## 2017-10-08 ENCOUNTER — Encounter (HOSPITAL_BASED_OUTPATIENT_CLINIC_OR_DEPARTMENT_OTHER): Payer: Self-pay | Admitting: *Deleted

## 2017-10-08 ENCOUNTER — Emergency Department (HOSPITAL_BASED_OUTPATIENT_CLINIC_OR_DEPARTMENT_OTHER)
Admission: EM | Admit: 2017-10-08 | Discharge: 2017-10-08 | Disposition: A | Discharge status: Home / Self Care | Payer: Medicare Other | Attending: Emergency Medicine | Admitting: Emergency Medicine

## 2017-10-08 ENCOUNTER — Other Ambulatory Visit: Payer: Self-pay

## 2017-10-08 DIAGNOSIS — Z7982 Long term (current) use of aspirin: Secondary | ICD-10-CM | POA: Insufficient documentation

## 2017-10-08 DIAGNOSIS — E039 Hypothyroidism, unspecified: Secondary | ICD-10-CM | POA: Diagnosis not present

## 2017-10-08 DIAGNOSIS — I10 Essential (primary) hypertension: Secondary | ICD-10-CM | POA: Diagnosis not present

## 2017-10-08 DIAGNOSIS — Z79899 Other long term (current) drug therapy: Secondary | ICD-10-CM | POA: Diagnosis not present

## 2017-10-08 DIAGNOSIS — Z96641 Presence of right artificial hip joint: Secondary | ICD-10-CM | POA: Diagnosis not present

## 2017-10-08 DIAGNOSIS — R21 Rash and other nonspecific skin eruption: Secondary | ICD-10-CM | POA: Diagnosis present

## 2017-10-08 MED ORDER — TRIAMCINOLONE ACETONIDE 0.1 % EX CREA: 1.0000 "application " | TOPICAL_CREAM | Freq: Two times a day (BID) | CUTANEOUS | 0 refills | Status: AC

## 2017-10-08 MED ORDER — DIPHENHYDRAMINE HCL 25 MG PO TABS: 25.0000 mg | ORAL_TABLET | Freq: Four times a day (QID) | ORAL | 0 refills | Status: AC | PRN

## 2017-10-08 MED FILL — TRIAMCINOLONE 0.1% CREAM: 0.1 | 15 days supply | Qty: 30 | Fill #0

## 2017-10-08 MED FILL — BANOPHEN 25 MG CAPSULE: 25 | 25 days supply | Qty: 100 | Fill #0

## 2017-10-08 NOTE — ED Provider Notes (Signed)
[facility] EMERGENCY DEPARTMENT Provider Note   CSN: [id number] Arrival date & time: 10/08/17  1400     History   Chief Complaint Chief Complaint  Patient presents with  . Rash    HPI [name] is a 84 y.o. female.  HPI  84 year old female presents with a rash.  Started about 5 days ago.  Started after she had cranberries at dinner and while she is not specifically allergic she thinks this might of caused it because she is allergic to other sweet things like honey.  She had cranberries again before she realized this was her presumed cause and thinks the rash got a little worse.  It is mostly periumbilical but does go to both the right and left side of the abdomen.  No other rash anywhere else including extremities or back.  There is no fever, vomiting, headache, chest pain.  She notes her blood pressure was high today and so she went to get checked out.  Her blood pressure currently is 186/106 he states is actually better than what it was at the nursing home.  She tried to see her doctor last week but he only comes once a week at the facility and will next come in 2 days.  She has been trying some hydrocortisone cream but thought she might need some Benadryl and so she want to get checked out of the hospital.  The abdomen does not hurt and the rash does not hurt but is itchy.  Past Medical History:  Diagnosis Date  . Ankle fracture   . Hyperlipidemia   . Hypertension   . Thyroid disease     Patient Active Problem List   Diagnosis Date Noted  . Unilateral primary osteoarthritis, right knee 05/02/2017  . Chronic right shoulder pain 07/24/2016  . Acute pain of right knee 06/26/2016  . Closed fracture of right fibula 10/25/2015  . Cellulitis 10/25/2015  . Cataract 10/07/2014  . Persistent miosis 10/07/2014  . Varicose veins of both lower extremities with pain 10/08/2013  . Glaucoma 06/25/2013  . Hypothyroidism 06/25/2013  . Vitamin D deficiency 06/25/2013  .  Dermatitis 08/10/2012  . Essential hypertension 08/10/2012  . Hypercholesterolemia 08/10/2012  . Senile osteoporosis 08/10/2012    Past Surgical History:  Procedure Laterality Date  . ABDOMINAL HYSTERECTOMY    . TOTAL HIP ARTHROPLASTY Right      OB History   None      Home Medications    Prior to Admission medications   Medication Sig Start Date End Date Taking? Authorizing Provider  acetaminophen (TYLENOL) 325 MG tablet Take 650 mg by mouth every 6 (six) hours as needed.    [provider]  aspirin EC 81 MG tablet Frequency:Daily   Dosage:0   MG  Instructions:  Note:Take 1 tablet daily 06/25/13   [provider]  Cholecalciferol (VITAMIN D3) 1000 units CAPS Frequency:Daily   Dosage:0   UNIT  Instructions:  Note:TAKE 1 CAPSULE Daily 06/25/13   [provider]  diphenhydrAMINE (BENADRYL) 25 MG tablet Take 1 tablet (25 mg total) by mouth every 6 (six) hours as needed for itching (or rash). 10/08/17   Pricilla Loveless, MD  enalapril (VASOTEC) 20 MG tablet Take 20 mg by mouth. 09/16/14   [provider]  latanoprost (XALATAN) 0.005 % ophthalmic solution Frequency:   Dosage:0.0     Instructions:  Note: 06/10/11   [provider]  levothyroxine (SYNTHROID, LEVOTHROID) 88 MCG tablet Take by mouth. 01/06/15   [provider]  lovastatin (MEVACOR) 20 MG tablet Take 20 mg by mouth. 02/03/15   [provider]  metoprolol tartrate (LOPRESSOR) 25 MG tablet Take 25 mg by mouth 2 (two) times daily.    [provider]  PRESCRIPTION MEDICATION Littanoprost eye drops .005    [provider]  triamcinolone cream (KENALOG) 0.1 % Apply 1 application topically 2 (two) times daily. 10/08/17   Pricilla LovelessGoldston, Amedee Cerrone, MD    Family History History reviewed. No pertinent family history.  Social History Social History   Tobacco Use  . Smoking status: Never Smoker  . Smokeless tobacco: Never Used  Substance Use Topics  . Alcohol use: No    . Drug use: No     Allergies   Codeine; Honey bee treatment [bee venom]; Levaquin [levofloxacin in d5w]; Penicillins; and Sulfa antibiotics   Review of Systems Review of Systems  Constitutional: Negative for fever.  Cardiovascular: Negative for chest pain.  Gastrointestinal: Negative for abdominal pain and vomiting.  Skin: Positive for rash.  Neurological: Negative for headaches.     Physical Exam Updated Vital Signs BP (!) 186/106 (BP Location: Left Arm)   Pulse 92   Temp 98 F (36.7 C)   Resp 16   Ht 5\' 5"  (1.651 m)   Wt 68.9 kg (152 lb)   SpO2 99%   BMI 25.29 kg/m   Physical Exam  Constitutional: She appears well-developed and well-nourished. No distress.  HENT:  Head: Normocephalic and atraumatic.  Nose: Nose normal.  Eyes: Right eye exhibits no discharge. Left eye exhibits no discharge.  Neck: Neck supple.  Pulmonary/Chest: Effort normal.  Abdominal: She exhibits no distension. There is no tenderness.  Neurological: She is alert.  Skin: Skin is warm and dry. Rash noted. Rash is papular. She is not diaphoretic.     Nursing note and vitals reviewed.    ED Treatments / Results  Labs (all labs ordered are listed, but only abnormal results are displayed) Labs Reviewed - No data to display  EKG None  Radiology No results found.  Procedures Procedures (including critical care time)  Medications Ordered in ED Medications - No data to display   Initial Impression / Assessment and Plan / ED Course  I have reviewed the triage vital signs and the nursing notes.  Pertinent labs & imaging results that were available during my care of the patient were reviewed by me and considered in my medical decision making (see chart for details).     Unclear where this rash is coming from.  It does not appear vesicular or like shingles and does cross the midline.  There is also no back rash.  It only appears to be itchy and with no pain.  I will prescribe her  triamcinolone and also low-dose Benadryl as I think this will help with itching.  Stop the hydrocortisone cream.  She is noted to be hypertensive and currently does not have any signs or symptoms of endorgan damage I think it is reasonable to have her follow-up with her PCP.  Discussed return precautions.  However I highly doubt an acute infectious or other emergent cause of rash.  Final Clinical Impressions(s) / ED Diagnoses   Final diagnoses:  Rash    ED Discharge Orders        Ordered    diphenhydrAMINE (BENADRYL) 25 MG tablet  Every 6 hours PRN     10/08/17 1531    triamcinolone cream (KENALOG) 0.1 %  2 times daily  10/08/17 1531       Pricilla Loveless, MD 10/08/17 1534

## 2017-10-08 NOTE — Discharge Instructions (Addendum)
If your rash worsens, you develop fever, vomiting, headache, or any other new/concerning symptoms or return to the ER for evaluation.  Otherwise follow-up with your primary care physician.

## 2017-10-08 NOTE — ED Triage Notes (Signed)
Pt c/o rash to abd x 5 days

## 2017-10-08 NOTE — ED Notes (Signed)
ED Provider at bedside. 

## 2017-10-10 IMAGING — DX DG ANKLE COMPLETE 3+V*R*
3 series · 3 of 3 positions shown · non-contrast
Comparison: None.

CLINICAL DATA: Fall 2 days ago. Right ankle pain and swelling.
Initial encounter.

EXAM:
RIGHT ANKLE - COMPLETE 3+ VIEW

[ankle ap]
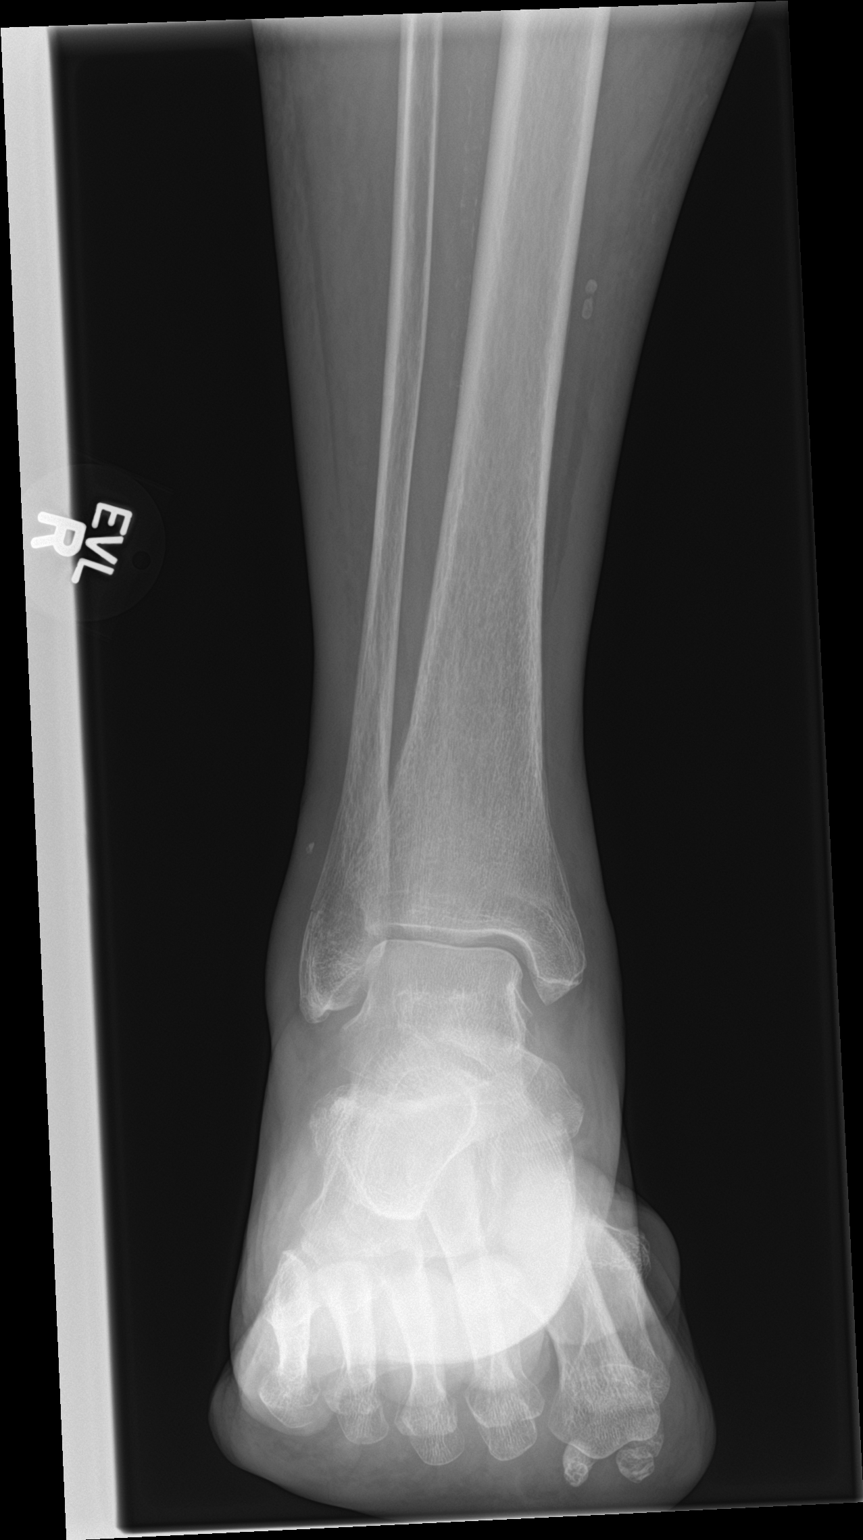

[ankle obl]
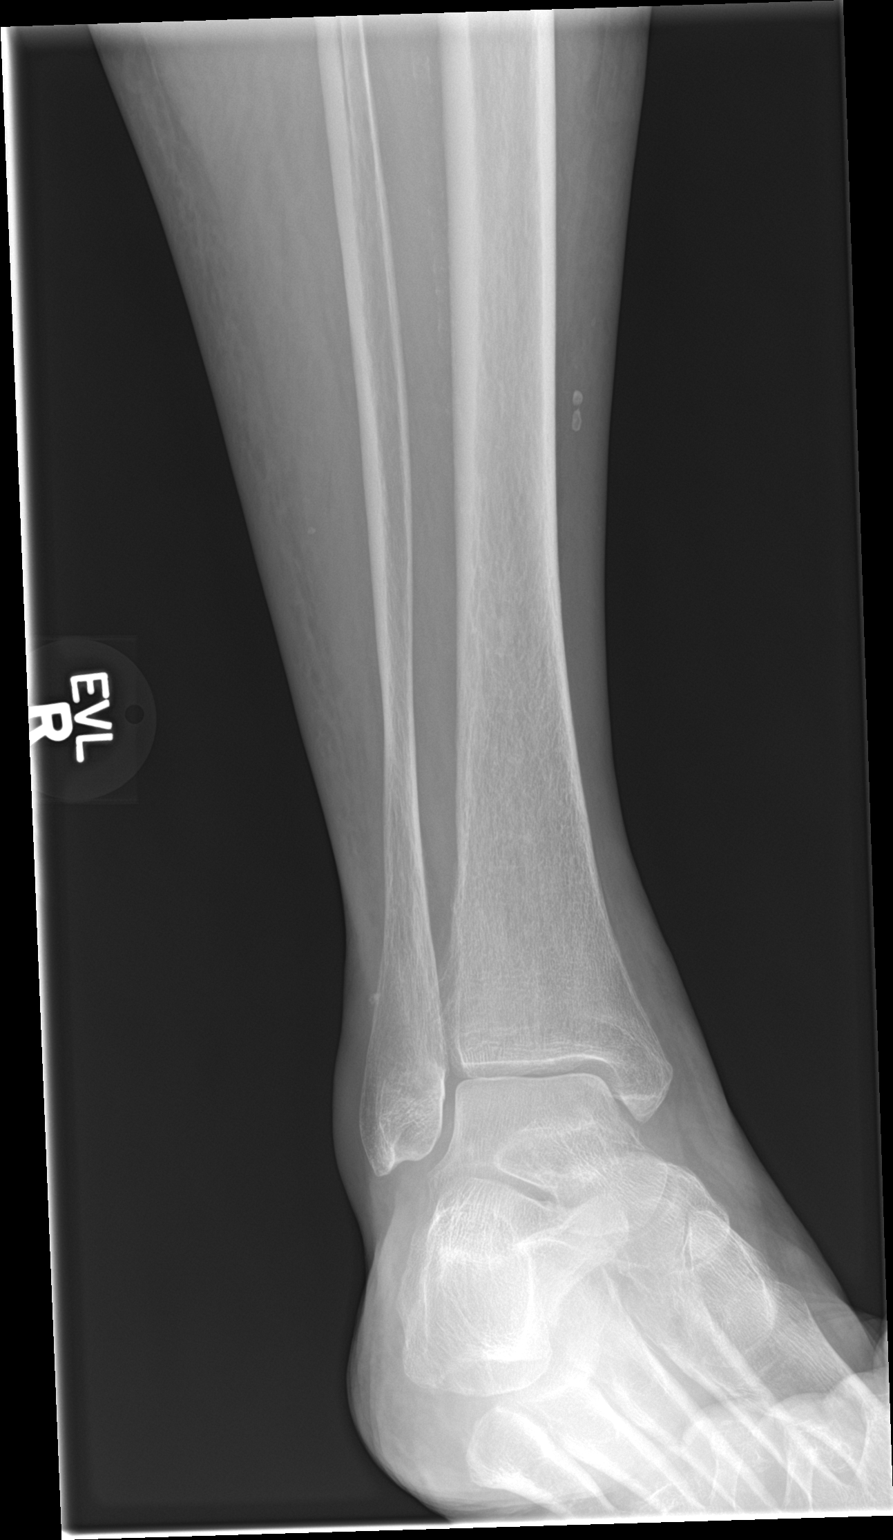

[ankle lat]
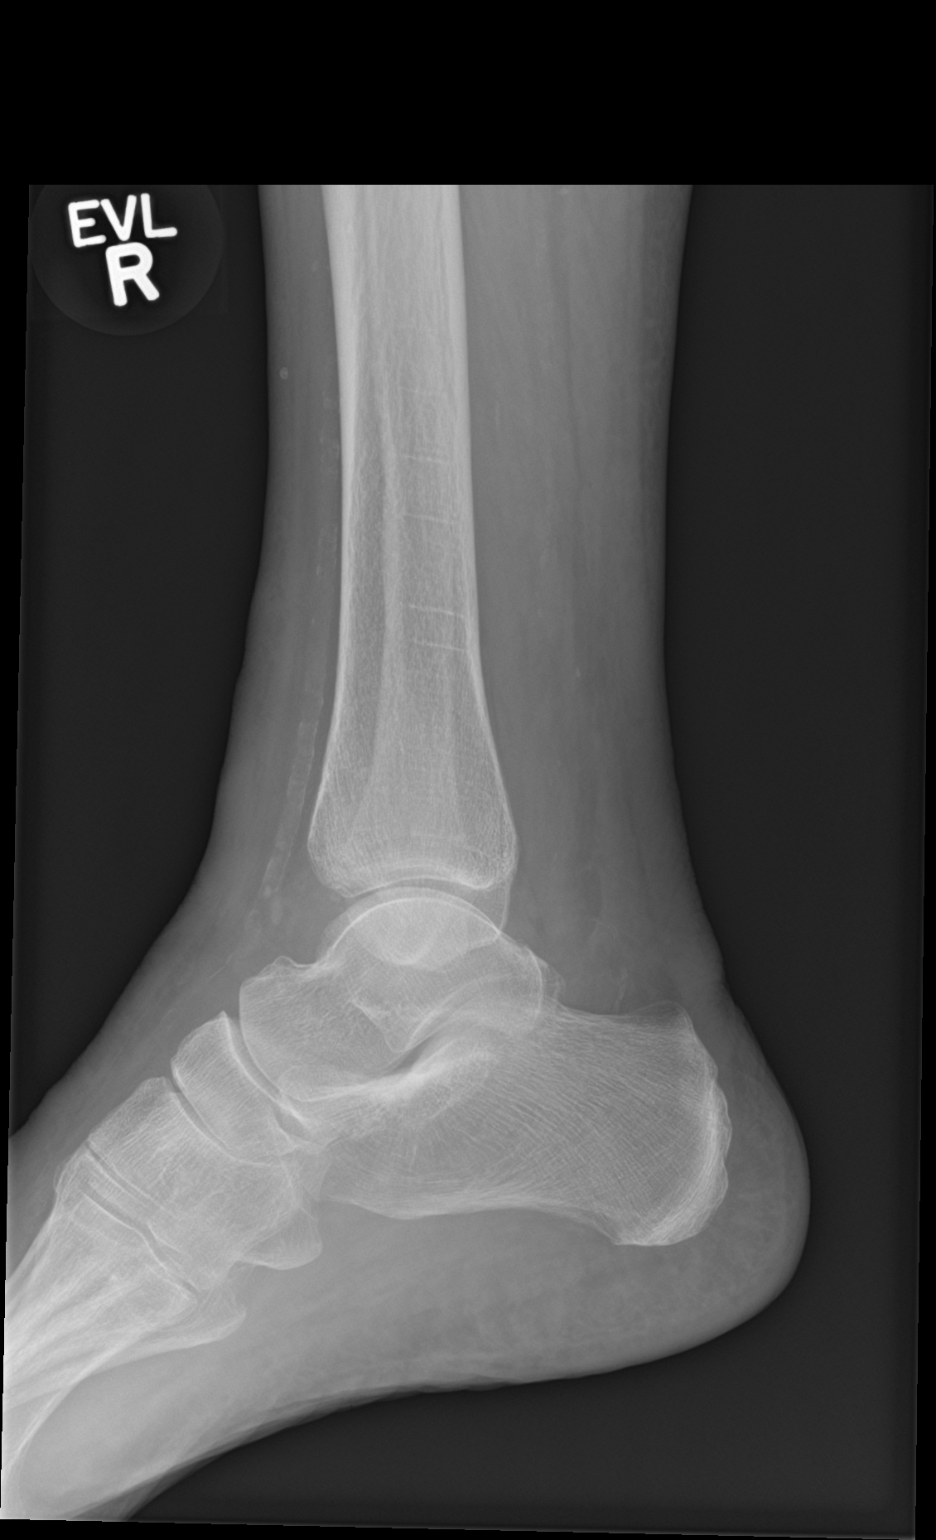

[3 of 3 positions shown; findings below may reference images not displayed]

FINDINGS: Nondisplaced fracture is seen involving the distal fibula just above
the level of the tibial plafond. No other fractures identified. No
evidence of dislocation. Peripheral vascular calcification noted.
IMPRESSION: Nondisplaced fracture of distal fibula.

## 2018-07-22 ENCOUNTER — Ambulatory Visit: Payer: Medicare Other | Admitting: Orthopaedic Surgery

## 2018-11-13 ENCOUNTER — Emergency Department (HOSPITAL_BASED_OUTPATIENT_CLINIC_OR_DEPARTMENT_OTHER): Payer: Medicare Other

## 2018-11-13 ENCOUNTER — Encounter (HOSPITAL_BASED_OUTPATIENT_CLINIC_OR_DEPARTMENT_OTHER): Payer: Self-pay | Admitting: *Deleted

## 2018-11-13 ENCOUNTER — Other Ambulatory Visit: Payer: Self-pay

## 2018-11-13 ENCOUNTER — Emergency Department (HOSPITAL_BASED_OUTPATIENT_CLINIC_OR_DEPARTMENT_OTHER)
Admission: EM | Admit: 2018-11-13 | Discharge: 2018-11-13 | Disposition: A | Discharge status: Home / Self Care | Payer: Medicare Other | Attending: Emergency Medicine | Admitting: Emergency Medicine

## 2018-11-13 DIAGNOSIS — Z7982 Long term (current) use of aspirin: Secondary | ICD-10-CM | POA: Insufficient documentation

## 2018-11-13 DIAGNOSIS — Z79899 Other long term (current) drug therapy: Secondary | ICD-10-CM | POA: Insufficient documentation

## 2018-11-13 DIAGNOSIS — I10 Essential (primary) hypertension: Secondary | ICD-10-CM | POA: Diagnosis present

## 2018-11-13 DIAGNOSIS — E039 Hypothyroidism, unspecified: Secondary | ICD-10-CM | POA: Insufficient documentation

## 2018-11-13 LAB — BASIC METABOLIC PANEL
Anion gap: 12 (ref 5–15)
BUN: 21 mg/dL (ref 8–23)
CO2: 23 mmol/L (ref 22–32)
Calcium: 9.3 mg/dL (ref 8.9–10.3)
Chloride: 105 mmol/L (ref 98–111)
Creatinine, Ser: 1.18 mg/dL — ABNORMAL HIGH (ref 0.44–1.00)
GFR calc Af Amer: 48 mL/min — ABNORMAL LOW (ref >60)
GFR calc non Af Amer: 41 mL/min — ABNORMAL LOW (ref >60)
Glucose, Bld: 118 mg/dL — ABNORMAL HIGH (ref 70–99)
Potassium: 3.8 mmol/L (ref 3.5–5.1)
Sodium: 140 mmol/L (ref 135–145)

## 2018-11-13 LAB — CBC WITH DIFFERENTIAL/PLATELET
Abs Immature Granulocytes: 0.02 10*3/uL (ref 0.00–0.07)
Basophils Absolute: 0 10*3/uL (ref 0.0–0.1)
Basophils Relative: 0 %
Eosinophils Absolute: 0.2 10*3/uL (ref 0.0–0.5)
Eosinophils Relative: 4 %
HCT: 44.6 % (ref 36.0–46.0)
Hemoglobin: 14.4 g/dL (ref 12.0–15.0)
Immature Granulocytes: 0 %
Lymphocytes Relative: 36 %
Lymphs Abs: 1.9 10*3/uL (ref 0.7–4.0)
MCH: 30.9 pg (ref 26.0–34.0)
MCHC: 32.3 g/dL (ref 30.0–36.0)
MCV: 95.7 fL (ref 80.0–100.0)
Monocytes Absolute: 0.6 10*3/uL (ref 0.1–1.0)
Monocytes Relative: 11 %
Neutro Abs: 2.7 10*3/uL (ref 1.7–7.7)
Neutrophils Relative %: 49 %
Platelets: 206 10*3/uL (ref 150–400)
RBC: 4.66 MIL/uL (ref 3.87–5.11)
RDW: 13.5 % (ref 11.5–15.5)
WBC: 5.5 10*3/uL (ref 4.0–10.5)
nRBC: 0 % (ref 0.0–0.2)

## 2018-11-13 LAB — TROPONIN I (HIGH SENSITIVITY): Troponin I (High Sensitivity): 6 ng/L (ref <18)

## 2018-11-13 MED ORDER — AMLODIPINE BESYLATE 5 MG PO TABS: 5.0000 mg | ORAL_TABLET | Freq: Every day | ORAL | 1 refills | Status: AC

## 2018-11-13 MED ORDER — AMLODIPINE BESYLATE 5 MG PO TABS
5.0000 mg | ORAL_TABLET | Freq: Once | ORAL | Status: AC
  Administered 2018-11-13: 17:00:00 5 mg via ORAL
  Filled 2018-11-13: qty 1

## 2018-11-13 MED FILL — AMLODIPINE BESYLATE 5 MG TA: 5 | 30 days supply | Qty: 30 | Fill #0

## 2018-11-13 NOTE — ED Triage Notes (Signed)
pt c/o increased BP x 1 week , c/o " chest heaviness " x 1 day

## 2018-11-13 NOTE — ED Notes (Signed)
ED Provider at bedside. 

## 2018-11-13 NOTE — Discharge Instructions (Signed)
Take amlodipine as prescribed starting tomorrow. Follow up with primary care doctor. Return to ED if you develop severe chest pain, SOB, stroke symptoms.

## 2018-11-13 NOTE — ED Provider Notes (Signed)
[facility] EMERGENCY DEPARTMENT Provider Note   CSN: [id number] Arrival date & time: 11/13/18  1604     History   Chief Complaint Chief Complaint  Patient presents with  . Hypertension    HPI [name] is a 85 y.o. female.     The history is provided by the patient.  Hypertension This is a chronic problem. The current episode started more than 1 week ago. The problem occurs daily. The problem has not changed since onset.Pertinent negatives include no chest pain, no abdominal pain, no headaches and no shortness of breath. Associated symptoms comments: Recently increase BP medication last week. No stroke symptoms. . Nothing aggravates the symptoms. Nothing relieves the symptoms. She has tried nothing for the symptoms.    Past Medical History:  Diagnosis Date  . Ankle fracture   . Hyperlipidemia   . Hypertension   . Thyroid disease     Patient Active Problem List   Diagnosis Date Noted  . Unilateral primary osteoarthritis, right knee 05/02/2017  . Chronic right shoulder pain 07/24/2016  . Acute pain of right knee 06/26/2016  . Closed fracture of right fibula 10/25/2015  . Cellulitis 10/25/2015  . Cataract 10/07/2014  . Persistent miosis 10/07/2014  . Varicose veins of both lower extremities with pain 10/08/2013  . Glaucoma 06/25/2013  . Hypothyroidism 06/25/2013  . Vitamin D deficiency 06/25/2013  . Dermatitis 08/10/2012  . Essential hypertension 08/10/2012  . Hypercholesterolemia 08/10/2012  . Senile osteoporosis 08/10/2012    Past Surgical History:  Procedure Laterality Date  . ABDOMINAL HYSTERECTOMY    . TOTAL HIP ARTHROPLASTY Right      OB History   No obstetric history on file.      Home Medications    Prior to Admission medications   Medication Sig Start Date End Date Taking? Authorizing Provider  acetaminophen (TYLENOL) 325 MG tablet Take 650 mg by mouth every 6 (six) hours as needed.    [provider]  amLODipine  (NORVASC) 5 MG tablet Take 1 tablet (5 mg total) by mouth daily. 11/13/18 12/13/18  Virgina Norfolk, DO  aspirin EC 81 MG tablet Frequency:Daily   Dosage:0   MG  Instructions:  Note:Take 1 tablet daily 06/25/13   [provider]  Cholecalciferol (VITAMIN D3) 1000 units CAPS Frequency:Daily   Dosage:0   UNIT  Instructions:  Note:TAKE 1 CAPSULE Daily 06/25/13   [provider]  diphenhydrAMINE (BENADRYL) 25 MG tablet Take 1 tablet (25 mg total) by mouth every 6 (six) hours as needed for itching (or rash). 10/08/17   Pricilla Loveless, MD  enalapril (VASOTEC) 20 MG tablet Take 20 mg by mouth. 09/16/14   [provider]  latanoprost (XALATAN) 0.005 % ophthalmic solution Frequency:   Dosage:0.0     Instructions:  Note: 06/10/11   [provider]  levothyroxine (SYNTHROID, LEVOTHROID) 88 MCG tablet Take by mouth. 01/06/15   [provider]  lovastatin (MEVACOR) 20 MG tablet Take 20 mg by mouth. 02/03/15   [provider]  metoprolol tartrate (LOPRESSOR) 25 MG tablet Take 25 mg by mouth 2 (two) times daily.    [provider]  PRESCRIPTION MEDICATION Littanoprost eye drops .005    [provider]  triamcinolone cream (KENALOG) 0.1 % Apply 1 application topically 2 (two) times daily. 10/08/17   Pricilla Loveless, MD    Family History History reviewed. No pertinent family history.  Social History Social History   Tobacco Use  . Smoking status: Never Smoker  .  Smokeless tobacco: Never Used  Substance Use Topics  . Alcohol use: No  . Drug use: No     Allergies   Codeine, Honey bee treatment [bee venom], Levaquin [levofloxacin in d5w], Penicillins, and Sulfa antibiotics   Review of Systems Review of Systems  Constitutional: Negative for chills and fever.  HENT: Negative for ear pain and sore throat.   Eyes: Negative for pain and visual disturbance.  Respiratory: Negative for cough and shortness of breath.   Cardiovascular: Negative for  chest pain and palpitations.  Gastrointestinal: Negative for abdominal pain and vomiting.  Genitourinary: Negative for dysuria and hematuria.  Musculoskeletal: Negative for arthralgias and back pain.  Skin: Negative for color change and rash.  Neurological: Negative for seizures, syncope and headaches.  All other systems reviewed and are negative.    Physical Exam Updated Vital Signs  ED Triage Vitals  Enc Vitals Group     BP 11/13/18 1609 (!) 215/107     Pulse Rate 11/13/18 1609 90     Resp 11/13/18 1609 18     Temp --      Temp Source 11/13/18 1609 Oral     SpO2 11/13/18 1609 99 %     Weight 11/13/18 1610 155 lb (70.3 kg)     Height 11/13/18 1610 5\' 6"  (1.676 m)     Head Circumference --      Peak Flow --      Pain Score 11/13/18 1609 0     Pain Loc --      Pain Edu? --      Excl. in [place]? --     Physical Exam Vitals signs and nursing note reviewed.  Constitutional:      General: She is not in acute distress.    Appearance: She is well-developed. She is not ill-appearing.  HENT:     Head: Normocephalic and atraumatic.     Nose: Nose normal.     Mouth/Throat:     Mouth: Mucous membranes are moist.  Eyes:     Extraocular Movements: Extraocular movements intact.     Conjunctiva/sclera: Conjunctivae normal.     Pupils: Pupils are equal, round, and reactive to light.  Neck:     Musculoskeletal: Normal range of motion and neck supple.  Cardiovascular:     Rate and Rhythm: Normal rate and regular rhythm.     Pulses: Normal pulses.     Heart sounds: Normal heart sounds. No murmur.  Pulmonary:     Effort: Pulmonary effort is normal. No respiratory distress.     Breath sounds: Normal breath sounds.  Abdominal:     Palpations: Abdomen is soft.     Tenderness: There is no abdominal tenderness.  Skin:    General: Skin is warm and dry.     Capillary Refill: Capillary refill takes less than 2 seconds.  Neurological:     General: No focal deficit present.     Mental  Status: She is alert and oriented to person, place, and time.     Cranial Nerves: No cranial nerve deficit.     Sensory: No sensory deficit.     Motor: No weakness.     Coordination: Coordination normal.     Gait: Gait normal.     Comments: 5+ out of 5 strength throughout, normal sensation, no drift, normal finger-to-nose finger      ED Treatments / Results  Labs (all labs ordered are listed, but only abnormal results are displayed) Labs Reviewed  BASIC METABOLIC  PANEL - Abnormal; Notable for the following components:      Result Value   Glucose, Bld 118 (*)    Creatinine, Ser 1.18 (*)    GFR calc non Af Amer 41 (*)    GFR calc Af Amer 48 (*)    All other components within normal limits  CBC WITH DIFFERENTIAL/PLATELET  TROPONIN I (HIGH SENSITIVITY)    EKG EKG Interpretation  Date/Time:  Wednesday November 13 2018 16:24:49 EDT Ventricular Rate:  84 PR Interval:    QRS Duration: 92 QT Interval:  407 QTC Calculation: 482 R Axis:   14 Text Interpretation:  Sinus rhythm Confirmed by Virgina NorfolkAdam, Malikai Gut [phone]([id number]) on 11/13/2018 4:27:20 PM   Radiology Dg Chest Portable 1 View  Result Date: 11/13/2018 CLINICAL DATA:  Hypertension chest pain EXAM: PORTABLE CHEST 1 VIEW COMPARISON:  08/11/2010 FINDINGS: Borderline cardiomegaly. No focal airspace disease or effusion. No pneumothorax. IMPRESSION: No active disease. Electronically Signed   By: Jasmine PangKim  Fujinaga M.D.   On: 11/13/2018 16:54    Procedures Procedures (including critical care time)  Medications Ordered in ED Medications  amLODipine (NORVASC) tablet 5 mg (has no administration in time range)     Initial Impression / Assessment and Plan / ED Course  I have reviewed the triage vital signs and the nursing notes.  Pertinent labs & imaging results that were available during my care of the patient were reviewed by me and considered in my medical decision making (see chart for details).     [name] is an 85 year old female  with history of hypertension, high cholesterol, thyroid disease who presents to the ED with hypertension.  Patient with blood pressure of 215/107 but on recheck 178/90.  Otherwise normal vitals.  Patient has had recent change in her blood pressure medication last month.  Over the last 2 weeks has noticed more consistent blood pressures in the 170 range.  She does not describe any specific chest pain or shortness of breath.  No strokelike symptoms.  Neurologically intact.  Currently no chest pain.  States that she feels sometimes a heaviness in her chest.  EKG here shows sinus rhythm.  No ischemic changes.  Blood work showed no significant anemia, electrolyte abnormality, kidney injury.  Chest x-ray showed no signs of pneumonia, pneumothorax, pleural effusion.  No infectious symptoms.  No concern for PE.  Troponin normal.  Patient has not had any chest pain.  Overall doubt ACS. No signs of end organ damage or HTN emergency. Likely just progression of her chronic hypertension.  Will start amlodipine and have her follow-up with her primary care doctor next week for further medication adjustments as needed.  Given return precautions and discharged in ED in good condition.  This chart was dictated using voice recognition software.  Despite best efforts to proofread,  errors can occur which can change the documentation meaning.    Final Clinical Impressions(s) / ED Diagnoses   Final diagnoses:  Hypertension, unspecified type    ED Discharge Orders         Ordered    amLODipine (NORVASC) 5 MG tablet  Daily     11/13/18 1653           Virgina NorfolkCuratolo, Hason Ofarrell, DO 11/13/18 1704

## 2019-10-27 ENCOUNTER — Telehealth: Payer: Self-pay

## 2019-10-27 NOTE — Telephone Encounter (Signed)
Patient called in wanting to ask questions regarding being seen at hospital

## 2019-10-28 NOTE — Telephone Encounter (Signed)
Called patient back and she was asking about a bill and I sent her to Drinda Butts to see if she can be more helpful then I was

## 2019-11-04 ENCOUNTER — Ambulatory Visit: Payer: Medicare Other | Admitting: Orthopaedic Surgery

## 2019-11-04 ENCOUNTER — Encounter: Payer: Self-pay | Admitting: Orthopaedic Surgery

## 2019-11-04 ENCOUNTER — Ambulatory Visit: Payer: Self-pay

## 2019-11-04 DIAGNOSIS — G8929 Other chronic pain: Secondary | ICD-10-CM | POA: Diagnosis not present

## 2019-11-04 DIAGNOSIS — M25561 Pain in right knee: Secondary | ICD-10-CM | POA: Diagnosis not present

## 2019-11-04 MED ORDER — LIDOCAINE HCL 1 % IJ SOLN
3.0000 mL | INTRAMUSCULAR | Status: AC | PRN
  Administered 2019-11-04: 3 mL

## 2019-11-04 MED ORDER — METHYLPREDNISOLONE ACETATE 40 MG/ML IJ SUSP
40.0000 mg | INTRAMUSCULAR | Status: AC | PRN
  Administered 2019-11-04: 40 mg via INTRA_ARTICULAR

## 2019-11-04 NOTE — Progress Notes (Signed)
Office Visit Note   Patient: [name]           Date of Birth: 01/17/1929           MRN: [id number] Visit Date: 11/04/2019              Requested by: Nadara Eaton, MD [address] [place],  [place] [address] PCP: Nadara Eaton, MD   Assessment & Plan: Visit Diagnoses:  1. Chronic pain of right knee     Plan: I did feel that it was appropriate to try an aspiration and steroid injection in the knee today given the fact that she is doing well with this in the past.  I was able to aspirate about 20 to 30 cc of clear fluid from the knee and then place a steroid injection in the right knee without difficulty.  We will try hinged knee braces for support and comfort.  All question concerns were answered and addressed.  Follow-up can be as needed.  Follow-Up Instructions: Return if symptoms worsen or fail to improve.   Orders:  Orders Placed This Encounter  Procedures  . Large Joint Inj  . XR Knee 1-2 Views Right   No orders of the defined types were placed in this encounter.     Procedures: Large Joint Inj: R knee on 11/04/2019 9:22 AM Indications: diagnostic evaluation and pain Details: 22 G 1.5 in needle, superolateral approach  Arthrogram: No  Medications: 3 mL lidocaine 1 %; 40 mg methylPREDNISolone acetate 40 MG/ML Outcome: tolerated well, no immediate complications Procedure, treatment alternatives, risks and benefits explained, specific risks discussed. Consent was given by the patient. Immediately prior to procedure a time out was called to verify the correct patient, procedure, equipment, support staff and site/side marked as required. Patient was prepped and draped in the usual sterile fashion.       Clinical Data: No additional findings.   Subjective: Chief Complaint  Patient presents with  . Right Knee - Pain  Would be interesting the patient is a very pleasant 86 year old female that we have actually seen before.  In 2018 she had an MRI of her  right knee that showed just some mild cartilage changes but the time had a large effusion and surprisingly x-rays that did not show any type of significant arthritic problems.  She has developed right knee pain for about a month now with swelling.  She points the lateral aspect of the source of pain with her right knee.  She ambulates with a cane.  Her daughter is with her and states that her mother lives in a senior center and is having some problems mobilizing due to the knee pain.  She has had no other acute change in her medical status.  HPI  Review of Systems There is currently no headache, chest pain, shortness of breath, fever, chills, nausea, vomiting  Objective: Vital Signs: There were no vitals taken for this visit.  Physical Exam She is alert and oriented and follows commands appropriately and in no acute distress but obvious discomfort Ortho Exam Examination of her right knee does show some slight valgus malalignment.  There is a mild to moderate effusion of the knee joint.  There is significant lateral joint line tenderness.  The knees feel stable otherwise. Specialty Comments:  No specialty comments available.  Imaging: XR Knee 1-2 Views Right  Result Date: 11/04/2019 2 views of the right knee show no acute findings.  The joint space is  still well-maintained.  There is mild to moderate 30 changes of the patellofemoral joint.    PMFS History: Patient Active Problem List   Diagnosis Date Noted  . Unilateral primary osteoarthritis, right knee 05/02/2017  . Chronic right shoulder pain 07/24/2016  . Acute pain of right knee 06/26/2016  . Closed fracture of right fibula 10/25/2015  . Cellulitis 10/25/2015  . Cataract 10/07/2014  . Persistent miosis 10/07/2014  . Varicose veins of both lower extremities with pain 10/08/2013  . Glaucoma 06/25/2013  . Hypothyroidism 06/25/2013  . Vitamin D deficiency 06/25/2013  . Dermatitis 08/10/2012  . Essential hypertension 08/10/2012    . Hypercholesterolemia 08/10/2012  . Senile osteoporosis 08/10/2012   Past Medical History:  Diagnosis Date  . Ankle fracture   . Hyperlipidemia   . Hypertension   . Thyroid disease     History reviewed. No pertinent family history.  Past Surgical History:  Procedure Laterality Date  . ABDOMINAL HYSTERECTOMY    . TOTAL HIP ARTHROPLASTY Right    Social History   Occupational History  . Not on file  Tobacco Use  . Smoking status: Never Smoker  . Smokeless tobacco: Never Used  Substance and Sexual Activity  . Alcohol use: No  . Drug use: No  . Sexual activity: Not on file

## 2020-01-26 ENCOUNTER — Encounter: Payer: Self-pay | Admitting: Orthopaedic Surgery

## 2020-01-26 ENCOUNTER — Ambulatory Visit: Payer: Medicare Other | Admitting: Orthopaedic Surgery

## 2020-01-26 ENCOUNTER — Other Ambulatory Visit: Payer: Self-pay

## 2020-01-26 DIAGNOSIS — M25461 Effusion, right knee: Secondary | ICD-10-CM | POA: Diagnosis not present

## 2020-01-26 DIAGNOSIS — M25561 Pain in right knee: Secondary | ICD-10-CM | POA: Diagnosis not present

## 2020-01-26 DIAGNOSIS — M1711 Unilateral primary osteoarthritis, right knee: Secondary | ICD-10-CM

## 2020-01-26 DIAGNOSIS — G8929 Other chronic pain: Secondary | ICD-10-CM | POA: Diagnosis not present

## 2020-01-26 MED ORDER — METHYLPREDNISOLONE ACETATE 40 MG/ML IJ SUSP
40.0000 mg | INTRAMUSCULAR | Status: AC | PRN
  Administered 2020-01-26: 40 mg via INTRA_ARTICULAR

## 2020-01-26 MED ORDER — LIDOCAINE HCL 1 % IJ SOLN
3.0000 mL | INTRAMUSCULAR | Status: AC | PRN
  Administered 2020-01-26: 3 mL

## 2020-01-26 NOTE — Progress Notes (Signed)
Office Visit Note   Patient: [name]           Date of Birth: 19-Jul-1928           MRN: [id number] Visit Date: 01/26/2020              Requested by: Nadara Eaton, MD [address] [place],  [place] [address] PCP: Nadara Eaton, MD   Assessment & Plan: Visit Diagnoses:  1. Chronic pain of right knee   2. Effusion, right knee   3. Unilateral primary osteoarthritis, right knee     Plan: I did place a steroid injection in her right knee today which she tolerated well.  I told her we could always repeat this in 3 months if needed.  I recommended Tylenol with arthritis as a medication to take intermittently as needed.  Follow-Up Instructions: Return if symptoms worsen or fail to improve.   Orders:  Orders Placed This Encounter  Procedures  . Large Joint Inj   No orders of the defined types were placed in this encounter.     Procedures: Large Joint Inj: R knee on 01/26/2020 1:45 PM Indications: diagnostic evaluation and pain Details: 22 G 1.5 in needle, superolateral approach  Arthrogram: No  Medications: 3 mL lidocaine 1 %; 40 mg methylPREDNISolone acetate 40 MG/ML Outcome: tolerated well, no immediate complications Procedure, treatment alternatives, risks and benefits explained, specific risks discussed. Consent was given by the patient. Immediately prior to procedure a time out was called to verify the correct patient, procedure, equipment, support staff and site/side marked as required. Patient was prepped and draped in the usual sterile fashion.       Clinical Data: No additional findings.   Subjective: Chief Complaint  Patient presents with  . Right Knee - Follow-up  The patient is an 86 year old female who is continue to follow-up with the right knee pain and swelling.  In late August we aspirated about 20 to 30 cc of clear fluid from her right knee and place a steroid injection in it.  She has been he has been swelling a lot and hurts in the back of  her knee.  She does wear a knee brace but she said does not really helping and its in going to try to put on.  She does ambulate using a cane.  She has had no other acute change in her medical status and has not injured the knee.  Surprisingly the x-rays her last visit showed some mild arthritic findings with still maintained joint space.  Certainly this could be more severe arthritis if an MRI is obtained but she is not interested in any other intervention including knee replacement surgery.  HPI  Review of Systems There is currently listed no fever, chills, nausea, vomiting  Objective: Vital Signs: There were no vitals taken for this visit.  Physical Exam She is alert and oriented x3 Ortho Exam Examination of her right knee shows some fullness of the knee in general but this is more posterior.  The calf is soft.  She has a painful arc of motion of her right knee and global tenderness.  I could not aspirate any fluid off of her knee. Specialty Comments:  No specialty comments available.  Imaging: No results found.   PMFS History: Patient Active Problem List   Diagnosis Date Noted  . Unilateral primary osteoarthritis, right knee 05/02/2017  . Chronic right shoulder pain 07/24/2016  . Acute pain of right knee 06/26/2016  .  Closed fracture of right fibula 10/25/2015  . Cellulitis 10/25/2015  . Cataract 10/07/2014  . Persistent miosis 10/07/2014  . Varicose veins of both lower extremities with pain 10/08/2013  . Glaucoma 06/25/2013  . Hypothyroidism 06/25/2013  . Vitamin D deficiency 06/25/2013  . Dermatitis 08/10/2012  . Essential hypertension 08/10/2012  . Hypercholesterolemia 08/10/2012  . Senile osteoporosis 08/10/2012   Past Medical History:  Diagnosis Date  . Ankle fracture   . Hyperlipidemia   . Hypertension   . Thyroid disease     History reviewed. No pertinent family history.  Past Surgical History:  Procedure Laterality Date  . ABDOMINAL HYSTERECTOMY    .  TOTAL HIP ARTHROPLASTY Right    Social History   Occupational History  . Not on file  Tobacco Use  . Smoking status: Never Smoker  . Smokeless tobacco: Never Used  Substance and Sexual Activity  . Alcohol use: No  . Drug use: No  . Sexual activity: Not on file

## 2020-04-20 ENCOUNTER — Emergency Department (HOSPITAL_BASED_OUTPATIENT_CLINIC_OR_DEPARTMENT_OTHER): Payer: Medicare Other

## 2020-04-20 ENCOUNTER — Emergency Department (HOSPITAL_BASED_OUTPATIENT_CLINIC_OR_DEPARTMENT_OTHER)
Admission: EM | Admit: 2020-04-20 | Discharge: 2020-04-20 | Disposition: A | Discharge status: Home / Self Care | Payer: Medicare Other | Attending: Emergency Medicine | Admitting: Emergency Medicine

## 2020-04-20 ENCOUNTER — Other Ambulatory Visit: Payer: Self-pay

## 2020-04-20 ENCOUNTER — Encounter (HOSPITAL_BASED_OUTPATIENT_CLINIC_OR_DEPARTMENT_OTHER): Payer: Self-pay

## 2020-04-20 DIAGNOSIS — W01198A Fall on same level from slipping, tripping and stumbling with subsequent striking against other object, initial encounter: Secondary | ICD-10-CM | POA: Diagnosis not present

## 2020-04-20 DIAGNOSIS — Y92092 Bedroom in other non-institutional residence as the place of occurrence of the external cause: Secondary | ICD-10-CM | POA: Insufficient documentation

## 2020-04-20 DIAGNOSIS — Z96641 Presence of right artificial hip joint: Secondary | ICD-10-CM | POA: Diagnosis not present

## 2020-04-20 DIAGNOSIS — Z7982 Long term (current) use of aspirin: Secondary | ICD-10-CM | POA: Diagnosis not present

## 2020-04-20 DIAGNOSIS — I10 Essential (primary) hypertension: Secondary | ICD-10-CM | POA: Insufficient documentation

## 2020-04-20 DIAGNOSIS — E039 Hypothyroidism, unspecified: Secondary | ICD-10-CM | POA: Insufficient documentation

## 2020-04-20 DIAGNOSIS — S0003XA Contusion of scalp, initial encounter: Secondary | ICD-10-CM | POA: Insufficient documentation

## 2020-04-20 DIAGNOSIS — S0990XA Unspecified injury of head, initial encounter: Secondary | ICD-10-CM | POA: Diagnosis present

## 2020-04-20 DIAGNOSIS — Z79899 Other long term (current) drug therapy: Secondary | ICD-10-CM | POA: Insufficient documentation

## 2020-04-20 NOTE — ED Provider Notes (Signed)
[facility] EMERGENCY DEPARTMENT Provider Note   CSN: [id number] Arrival date & time: 04/20/20  1933     History Chief Complaint  Patient presents with  . Head Injury    RIPLEY LOVECCHIO is a 87 y.o. female presented emerge department mechanical fall and head injury.  She reports that she was in a dark room today watching a movie and then lost her footing and tripped.  She fell to the ground, subsequently rolled backwards and struck her left temple on a desk.  There is no loss of consciousness.  This accident occurred around 2 PM.  EMS was called to the scene but the patient refused transport at that time.  She went home and applied ice to a hematoma on her head.  She reports she has a minimal headache but is mostly sore at the site of impact.  She is not on blood thinners.  She denies neck pain or any other injuries.  She is present with her son at the bedside.  She reported some right-sided chest soreness earlier but is not actively having any soreness.  She walks with a cane.  HPI     Past Medical History:  Diagnosis Date  . Ankle fracture   . Hyperlipidemia   . Hypertension   . Thyroid disease     Patient Active Problem List   Diagnosis Date Noted  . Unilateral primary osteoarthritis, right knee 05/02/2017  . Chronic right shoulder pain 07/24/2016  . Acute pain of right knee 06/26/2016  . Closed fracture of right fibula 10/25/2015  . Cellulitis 10/25/2015  . Cataract 10/07/2014  . Persistent miosis 10/07/2014  . Varicose veins of both lower extremities with pain 10/08/2013  . Glaucoma 06/25/2013  . Hypothyroidism 06/25/2013  . Vitamin D deficiency 06/25/2013  . Dermatitis 08/10/2012  . Essential hypertension 08/10/2012  . Hypercholesterolemia 08/10/2012  . Senile osteoporosis 08/10/2012    Past Surgical History:  Procedure Laterality Date  . ABDOMINAL HYSTERECTOMY    . TOTAL HIP ARTHROPLASTY Right      OB History   No obstetric history on file.      No family history on file.  Social History   Tobacco Use  . Smoking status: Never Smoker  . Smokeless tobacco: Never Used  Substance Use Topics  . Alcohol use: No  . Drug use: No    Home Medications Prior to Admission medications   Medication Sig Start Date End Date Taking? Authorizing Provider  acetaminophen (TYLENOL) 325 MG tablet Take 650 mg by mouth every 6 (six) hours as needed.    [provider]  amLODipine (NORVASC) 5 MG tablet Take 1 tablet (5 mg total) by mouth daily. 11/13/18 12/13/18  Virgina Norfolk, DO  aspirin EC 81 MG tablet Frequency:Daily   Dosage:0   MG  Instructions:  Note:Take 1 tablet daily 06/25/13   [provider]  Cholecalciferol (VITAMIN D3) 1000 units CAPS Frequency:Daily   Dosage:0   UNIT  Instructions:  Note:TAKE 1 CAPSULE Daily 06/25/13   [provider]  diphenhydrAMINE (BENADRYL) 25 MG tablet Take 1 tablet (25 mg total) by mouth every 6 (six) hours as needed for itching (or rash). 10/08/17   Pricilla Loveless, MD  enalapril (VASOTEC) 20 MG tablet Take 20 mg by mouth. 09/16/14   [provider]  latanoprost (XALATAN) 0.005 % ophthalmic solution Frequency:   Dosage:0.0     Instructions:  Note: 06/10/11   [provider]  levothyroxine (SYNTHROID, LEVOTHROID) 88 MCG tablet Take  by mouth. 01/06/15   [provider]  lovastatin (MEVACOR) 20 MG tablet Take 20 mg by mouth. 02/03/15   [provider]  metoprolol tartrate (LOPRESSOR) 25 MG tablet Take 25 mg by mouth 2 (two) times daily.    [provider]  PRESCRIPTION MEDICATION Littanoprost eye drops .005    [provider]  triamcinolone cream (KENALOG) 0.1 % Apply 1 application topically 2 (two) times daily. 10/08/17   Pricilla Loveless, MD    Allergies    Codeine, Honey bee treatment [bee venom], Levaquin [levofloxacin in d5w], Penicillins, and Sulfa antibiotics  Review of Systems   Review of Systems  Constitutional: Negative for chills  and fever.  Eyes: Negative for photophobia and visual disturbance.  Respiratory: Negative for cough and shortness of breath.   Cardiovascular: Negative for chest pain and palpitations.  Gastrointestinal: Negative for abdominal pain and vomiting.  Musculoskeletal: Negative for arthralgias and myalgias.  Skin: Negative for color change and rash.  Neurological: Positive for headaches. Negative for syncope.  All other systems reviewed and are negative.   Physical Exam Updated Vital Signs BP 122/86 (BP Location: Right Arm)   Pulse 77   Temp 98 F (36.7 C) (Oral)   Resp 18   Ht 5\' 6"  (1.676 m)   Wt 70.3 kg   SpO2 99%   BMI 25.02 kg/m   Physical Exam Constitutional:      General: She is not in acute distress. HENT:     Head: Normocephalic.     Comments: Small hematoma left parietal scalp, no open bleed Eyes:     Conjunctiva/sclera: Conjunctivae normal.     Pupils: Pupils are equal, round, and reactive to light.  Cardiovascular:     Rate and Rhythm: Normal rate and regular rhythm.  Pulmonary:     Effort: Pulmonary effort is normal. No respiratory distress.  Abdominal:     General: There is no distension.     Tenderness: There is no abdominal tenderness.  Musculoskeletal:        General: No swelling, tenderness, deformity or signs of injury.     Cervical back: Normal range of motion and neck supple. No rigidity or tenderness.  Skin:    General: Skin is warm and dry.  Neurological:     General: No focal deficit present.     Mental Status: She is alert. Mental status is at baseline.  Psychiatric:        Mood and Affect: Mood normal.        Behavior: Behavior normal.     ED Results / Procedures / Treatments   Labs (all labs ordered are listed, but only abnormal results are displayed) Labs Reviewed - No data to display  EKG None  Radiology CT Head Wo Contrast  Result Date: 04/20/2020 CLINICAL DATA:  Head trauma left parietal hematoma EXAM: CT HEAD WITHOUT CONTRAST  TECHNIQUE: Contiguous axial images were obtained from the base of the skull through the vertex without intravenous contrast. COMPARISON:  None. FINDINGS: Brain: No acute territorial infarction, hemorrhage or intracranial mass. Moderate atrophy. Extensive hypodensity within the white matter consistent with chronic small vessel ischemic change. Nonenlarged ventricles. Vascular: No hyperdense vessels.  Carotid vascular calcification Skull: Normal. Negative for fracture or focal lesion. Sinuses/Orbits: No acute finding. Other: Small left parietal scalp hematoma IMPRESSION: 1. No CT evidence for acute intracranial abnormality. 2. Atrophy and chronic small vessel ischemic changes of the white matter. 3. Small left parietal scalp hematoma. Electronically Signed   By: 04/22/2020  Jake Samples M.D.   On: 04/20/2020 21:09    Procedures Procedures   Medications Ordered in ED Medications - No data to display  ED Course  I have reviewed the triage vital signs and the nursing notes.  Pertinent labs & imaging results that were available during my care of the patient were reviewed by me and considered in my medical decision making (see chart for details).  87 yo female here w/ mech low-impact fall Hematoma to left parietal scalp  Neurovascularly intact No confusion Patient appears comfortable sitting in chair, son at bedside No other acute traumatic injuries noted on exam  Will obtain [facility] given her age - if negative anticipate discharge home   Clinical Course as of 04/21/20 1113  Tue Apr 20, 2020  2129  IMPRESSION: 1. No CT evidence for acute intracranial abnormality. 2. Atrophy and chronic small vessel ischemic changes of the white matter. 3. Small left parietal scalp hematoma. [MT]  2129 Ok to d/c [MT]    Clinical Course User Index [MT] Jazsmin Couse, Kermit Balo, MD   Final Clinical Impression(s) / ED Diagnoses Final diagnoses:  Injury of head, initial encounter  Hematoma of scalp, initial encounter    Rx  / DC Orders ED Discharge Orders    None       Terald Sleeper, MD 04/21/20 1113

## 2020-04-20 NOTE — ED Triage Notes (Addendum)
Pt states she tripped/fell ~230pm-pain to left parietal area-no break in skin noted-no LOC-pt denies taking blood thinners-states she had ice to area and refused need for ice pack at present-NAD-to triage in w/c-pt states she lives independent at [place] with pt-states EMS was called after fall-pt refused transport
